# Patient Record
Sex: Male | Born: 1955 | Race: White | Hispanic: No | Marital: Married | Smoking: Former smoker
Health system: Southern US, Community
[De-identification: ages and names within clinical notes are randomized; demographics above are authoritative.]

## PROBLEM LIST (undated history)

## (undated) DIAGNOSIS — K219 Gastro-esophageal reflux disease without esophagitis: Secondary | ICD-10-CM

## (undated) DIAGNOSIS — L570 Actinic keratosis: Secondary | ICD-10-CM

## (undated) DIAGNOSIS — C9110 Chronic lymphocytic leukemia of B-cell type not having achieved remission: Principal | ICD-10-CM

## (undated) DIAGNOSIS — M069 Rheumatoid arthritis, unspecified: Secondary | ICD-10-CM

## (undated) DIAGNOSIS — K649 Unspecified hemorrhoids: Secondary | ICD-10-CM

## (undated) DIAGNOSIS — Z96641 Presence of right artificial hip joint: Secondary | ICD-10-CM

## (undated) DIAGNOSIS — B1910 Unspecified viral hepatitis B without hepatic coma: Secondary | ICD-10-CM

## (undated) DIAGNOSIS — R768 Other specified abnormal immunological findings in serum: Secondary | ICD-10-CM

## (undated) HISTORY — DX: Gastro-esophageal reflux disease without esophagitis: K21.9

## (undated) HISTORY — PX: COLONOSCOPY: SHX174

## (undated) HISTORY — DX: Chronic lymphocytic leukemia of B-cell type not having achieved remission: C91.10

## (undated) HISTORY — PX: HERNIA REPAIR: SHX51

## (undated) HISTORY — DX: Unspecified hemorrhoids: K64.9

## (undated) HISTORY — DX: Other specified abnormal immunological findings in serum: R76.8

## (undated) HISTORY — DX: Presence of right artificial hip joint: Z96.641

## (undated) HISTORY — DX: Unspecified viral hepatitis B without hepatic coma: B19.10

## (undated) HISTORY — DX: Actinic keratosis: L57.0

## (undated) HISTORY — DX: Rheumatoid arthritis, unspecified: M06.9

---

## 2006-08-31 ENCOUNTER — Ambulatory Visit: Payer: Self-pay | Admitting: Internal Medicine

## 2006-08-31 LAB — CONVERTED CEMR LAB
ALT: 49 units/L — ABNORMAL HIGH (ref 0–40)
Alkaline Phosphatase: 74 units/L (ref 39–117)
BUN: 14 mg/dL (ref 6–23)
Bilirubin, Direct: 0.1 mg/dL (ref 0.0–0.3)
Creatinine, Ser: 0.8 mg/dL (ref 0.4–1.5)
Glucose, Bld: 81 mg/dL (ref 70–99)
Hep B S Ab: POSITIVE — AB

## 2008-04-12 ENCOUNTER — Ambulatory Visit: Payer: Self-pay | Admitting: Internal Medicine

## 2009-03-04 ENCOUNTER — Telehealth (INDEPENDENT_AMBULATORY_CARE_PROVIDER_SITE_OTHER): Payer: Self-pay | Admitting: *Deleted

## 2009-03-12 ENCOUNTER — Ambulatory Visit: Payer: Self-pay | Admitting: Internal Medicine

## 2009-04-02 ENCOUNTER — Ambulatory Visit: Payer: Self-pay | Admitting: Family Medicine

## 2009-04-02 DIAGNOSIS — R1031 Right lower quadrant pain: Secondary | ICD-10-CM

## 2009-05-06 ENCOUNTER — Ambulatory Visit: Payer: Self-pay | Admitting: Family Medicine

## 2009-10-01 ENCOUNTER — Ambulatory Visit: Payer: Self-pay | Admitting: Family Medicine

## 2009-10-01 DIAGNOSIS — R5383 Other fatigue: Secondary | ICD-10-CM

## 2009-10-01 DIAGNOSIS — R5381 Other malaise: Secondary | ICD-10-CM

## 2009-10-06 DIAGNOSIS — R945 Abnormal results of liver function studies: Secondary | ICD-10-CM | POA: Insufficient documentation

## 2009-10-06 DIAGNOSIS — D729 Disorder of white blood cells, unspecified: Secondary | ICD-10-CM | POA: Insufficient documentation

## 2009-10-06 LAB — CONVERTED CEMR LAB
Alkaline Phosphatase: 67 units/L (ref 39–117)
BUN: 13 mg/dL (ref 6–23)
Basophils Absolute: 0.1 10*3/uL (ref 0.0–0.1)
Calcium: 9.7 mg/dL (ref 8.4–10.5)
Chloride: 105 meq/L (ref 96–112)
Cholesterol: 185 mg/dL (ref 0–200)
Eosinophils Absolute: 0.6 10*3/uL (ref 0.0–0.7)
GFR calc non Af Amer: 74.83 mL/min (ref >60)
HCT: 44.5 % (ref 39.0–52.0)
Hemoglobin: 15.1 g/dL (ref 13.0–17.0)
LDL Cholesterol: 134 mg/dL — ABNORMAL HIGH (ref 0–99)
MCV: 84.9 fL (ref 78.0–100.0)
Neutro Abs: 4.2 10*3/uL (ref 1.4–7.7)
Neutrophils Relative %: 29.9 % — ABNORMAL LOW (ref 43.0–77.0)
Potassium: 5.3 meq/L — ABNORMAL HIGH (ref 3.5–5.1)
RBC: 5.24 M/uL (ref 4.22–5.81)
RDW: 14.6 % (ref 11.5–14.6)
Sodium: 144 meq/L (ref 135–145)
VLDL: 11 mg/dL (ref 0.0–40.0)
WBC: 14.1 10*3/uL — ABNORMAL HIGH (ref 4.5–10.5)

## 2009-11-03 ENCOUNTER — Encounter: Payer: Self-pay | Admitting: Family Medicine

## 2009-11-05 ENCOUNTER — Encounter (INDEPENDENT_AMBULATORY_CARE_PROVIDER_SITE_OTHER): Payer: Self-pay | Admitting: *Deleted

## 2009-11-19 ENCOUNTER — Ambulatory Visit: Payer: Self-pay | Admitting: Family Medicine

## 2009-11-24 LAB — CONVERTED CEMR LAB
Albumin: 3.8 g/dL (ref 3.5–5.2)
Alkaline Phosphatase: 70 units/L (ref 39–117)
Basophils Absolute: 0 10*3/uL (ref 0.0–0.1)
Basophils Relative: 0.3 % (ref 0.0–3.0)
Eosinophils Relative: 4.6 % (ref 0.0–5.0)
HCT: 42.1 % (ref 39.0–52.0)
Hemoglobin: 14.3 g/dL (ref 13.0–17.0)
Lymphocytes Relative: 66.6 % — ABNORMAL HIGH (ref 12.0–46.0)
Monocytes Absolute: 0.6 10*3/uL (ref 0.1–1.0)
Neutro Abs: 3.6 10*3/uL (ref 1.4–7.7)
Total Bilirubin: 0.5 mg/dL (ref 0.3–1.2)
Total Protein: 6.3 g/dL (ref 6.0–8.3)
WBC: 14.6 10*3/uL — ABNORMAL HIGH (ref 4.5–10.5)

## 2009-12-10 ENCOUNTER — Ambulatory Visit: Payer: Self-pay | Admitting: Family Medicine

## 2009-12-10 DIAGNOSIS — D7289 Other specified disorders of white blood cells: Secondary | ICD-10-CM

## 2009-12-10 DIAGNOSIS — K4090 Unilateral inguinal hernia, without obstruction or gangrene, not specified as recurrent: Secondary | ICD-10-CM | POA: Insufficient documentation

## 2009-12-10 DIAGNOSIS — B182 Chronic viral hepatitis C: Secondary | ICD-10-CM | POA: Insufficient documentation

## 2009-12-10 LAB — CONVERTED CEMR LAB
Basophils Absolute: 0.1 10*3/uL (ref 0.0–0.1)
Basophils Relative: 0.6 % (ref 0.0–3.0)
HCT: 45 % (ref 39.0–52.0)
Hemoglobin: 15.3 g/dL (ref 13.0–17.0)
Lymphocytes Relative: 61 % — ABNORMAL HIGH (ref 12.0–46.0)
Lymphs Abs: 8.6 10*3/uL — ABNORMAL HIGH (ref 0.7–4.0)
MCV: 86.2 fL (ref 78.0–100.0)
Monocytes Relative: 3.8 % (ref 3.0–12.0)
Neutro Abs: 4.1 10*3/uL (ref 1.4–7.7)
Neutrophils Relative %: 29.3 % — ABNORMAL LOW (ref 43.0–77.0)
WBC: 14.1 10*3/uL — ABNORMAL HIGH (ref 4.5–10.5)

## 2009-12-16 ENCOUNTER — Telehealth: Payer: Self-pay | Admitting: Family Medicine

## 2009-12-18 ENCOUNTER — Ambulatory Visit: Payer: Self-pay | Admitting: Oncology

## 2010-01-06 ENCOUNTER — Other Ambulatory Visit: Admission: RE | Admit: 2010-01-06 | Discharge: 2010-01-06 | Payer: Self-pay | Admitting: Oncology

## 2010-01-06 ENCOUNTER — Encounter: Payer: Self-pay | Admitting: Family Medicine

## 2010-01-06 LAB — CBC WITH DIFFERENTIAL (CANCER CENTER ONLY)
BASO#: 0.2 10*3/uL (ref 0.0–0.2)
BASO%: 1.4 % (ref 0.0–2.0)
Eosinophils Absolute: 0.5 10*3/uL (ref 0.0–0.5)
LYMPH#: 8 10*3/uL — ABNORMAL HIGH (ref 0.9–3.3)
LYMPH%: 67.3 % — ABNORMAL HIGH (ref 14.0–48.0)
MCH: 28.3 pg (ref 28.0–33.4)
MCV: 82 fL (ref 82–98)
MONO#: 0.5 10*3/uL (ref 0.1–0.9)
NEUT%: 22.8 % — ABNORMAL LOW (ref 40.0–80.0)
RBC: 4.97 10*6/uL (ref 4.20–5.70)
RDW: 12.9 % (ref 10.5–14.6)

## 2010-01-06 LAB — MORPHOLOGY - CHCC SATELLITE
PLT EST ~~LOC~~: ADEQUATE
RBC Comments: NORMAL

## 2010-01-06 LAB — CMP (CANCER CENTER ONLY)
ALT(SGPT): 41 U/L (ref 10–47)
CO2: 31 mEq/L (ref 18–33)
Sodium: 134 mEq/L (ref 128–145)
Total Protein: 6.2 g/dL — ABNORMAL LOW (ref 6.4–8.1)

## 2010-01-07 LAB — RETICULOCYTES (CHCC)
RBC.: 5.11 MIL/uL (ref 4.22–5.81)
Retic Ct Pct: 0.8 % (ref 0.4–3.1)

## 2010-01-07 LAB — DIRECT ANTIGLOBULIN TEST (NOT AT ARMC): DAT (Complement): NEGATIVE

## 2010-01-07 LAB — IGG, IGA, IGM
IgA: 71 mg/dL (ref 68–378)
IgG (Immunoglobin G), Serum: 846 mg/dL (ref 694–1618)
IgM, Serum: 48 mg/dL — ABNORMAL LOW (ref 60–263)

## 2010-01-12 DIAGNOSIS — C9110 Chronic lymphocytic leukemia of B-cell type not having achieved remission: Secondary | ICD-10-CM

## 2010-01-12 HISTORY — DX: Chronic lymphocytic leukemia of B-cell type not having achieved remission: C91.10

## 2010-01-19 ENCOUNTER — Encounter: Payer: Self-pay | Admitting: Family Medicine

## 2010-01-20 LAB — ZAP-70 - CHCC SATELLITE

## 2010-01-23 ENCOUNTER — Ambulatory Visit: Payer: Self-pay | Admitting: Oncology

## 2010-01-29 ENCOUNTER — Encounter: Payer: Self-pay | Admitting: Family Medicine

## 2010-01-29 LAB — CBC WITH DIFFERENTIAL (CANCER CENTER ONLY)
BASO#: 0.3 10*3/uL — ABNORMAL HIGH (ref 0.0–0.2)
BASO%: 2.1 % — ABNORMAL HIGH (ref 0.0–2.0)
Eosinophils Absolute: 0.7 10*3/uL — ABNORMAL HIGH (ref 0.0–0.5)
HCT: 43.7 % (ref 38.7–49.9)
HGB: 14.9 g/dL (ref 13.0–17.1)
LYMPH#: 10.1 10*3/uL — ABNORMAL HIGH (ref 0.9–3.3)
LYMPH%: 63.4 % — ABNORMAL HIGH (ref 14.0–48.0)
MONO#: 0.8 10*3/uL (ref 0.1–0.9)
MONO%: 5 % (ref 0.0–13.0)
NEUT#: 4 10*3/uL (ref 1.5–6.5)
RBC: 5.32 10*6/uL (ref 4.20–5.70)

## 2010-02-04 ENCOUNTER — Ambulatory Visit (HOSPITAL_COMMUNITY): Admission: RE | Admit: 2010-02-04 | Discharge: 2010-02-04 | Payer: Self-pay | Admitting: Oncology

## 2010-02-23 ENCOUNTER — Encounter: Payer: Self-pay | Admitting: Family Medicine

## 2010-03-18 ENCOUNTER — Ambulatory Visit: Payer: Self-pay | Admitting: Oncology

## 2010-03-24 ENCOUNTER — Encounter: Payer: Self-pay | Admitting: Family Medicine

## 2010-03-24 LAB — CBC WITH DIFFERENTIAL (CANCER CENTER ONLY)
HCT: 42.3 % (ref 38.7–49.9)
LYMPH#: 8.1 10*3/uL — ABNORMAL HIGH (ref 0.9–3.3)
MCHC: 34 g/dL (ref 32.0–35.9)
MCV: 83 fL (ref 82–98)
Platelets: 301 10*3/uL (ref 145–400)
RDW: 13.3 % (ref 10.5–14.6)
WBC: 13.5 10*3/uL — ABNORMAL HIGH (ref 4.0–10.0)

## 2010-03-24 LAB — MORPHOLOGY - CHCC SATELLITE
PLT EST ~~LOC~~: ADEQUATE
Platelet Morphology: NORMAL
RBC Comments: NORMAL

## 2010-07-02 ENCOUNTER — Ambulatory Visit: Payer: Self-pay | Admitting: Oncology

## 2010-07-06 ENCOUNTER — Ambulatory Visit (HOSPITAL_COMMUNITY)
Admission: RE | Admit: 2010-07-06 | Discharge: 2010-07-06 | Payer: Self-pay | Source: Home / Self Care | Attending: Oncology | Admitting: Oncology

## 2010-07-06 LAB — CBC WITH DIFFERENTIAL/PLATELET
BASO%: 0.7 % (ref 0.0–2.0)
Basophils Absolute: 0.1 10*3/uL (ref 0.0–0.1)
Eosinophils Absolute: 0.3 10*3/uL (ref 0.0–0.5)
HCT: 43.4 % (ref 38.4–49.9)
HGB: 14.5 g/dL (ref 13.0–17.1)
LYMPH%: 51.1 % — ABNORMAL HIGH (ref 14.0–49.0)
MCH: 28.2 pg (ref 27.2–33.4)
MCHC: 33.5 g/dL (ref 32.0–36.0)
MCV: 84.2 fL (ref 79.3–98.0)
MONO%: 3.8 % (ref 0.0–14.0)
NEUT%: 42.3 % (ref 39.0–75.0)
Platelets: 285 10*3/uL (ref 140–400)
RBC: 5.16 10*6/uL (ref 4.20–5.82)
lymph#: 6.3 10*3/uL — ABNORMAL HIGH (ref 0.9–3.3)

## 2010-07-06 LAB — CMP (CANCER CENTER ONLY)
Albumin: 3.6 g/dL (ref 3.3–5.5)
BUN, Bld: 16 mg/dL (ref 7–22)
CO2: 32 mEq/L (ref 18–33)
Potassium: 4.1 mEq/L (ref 3.3–4.7)

## 2010-07-07 LAB — LACTATE DEHYDROGENASE: LDH: 186 U/L (ref 94–250)

## 2010-07-15 NOTE — Letter (Signed)
Summary: [facility]  [facility]   Imported By: Lanelle Bal 04/08/2010 10:48:01  _____________________________________________________________________  External Attachment:    Type:   Image     Comment:   External Document

## 2010-07-15 NOTE — Consult Note (Signed)
[facility]   Imported By: Lanelle Bal 01/14/2010 09:09:39  _____________________________________________________________________  External Attachment:    Type:   Image     Comment:   External Document

## 2010-07-15 NOTE — Letter (Signed)
[facility]   Imported By: Lester [place] 03/03/2010 10:49:36  _____________________________________________________________________  External Attachment:    Type:   Image     Comment:   External Document  Appended Document: [facility] reviewed

## 2010-07-15 NOTE — Letter (Signed)
Summary: [place] No Show Letter  [place] at [facility]  [address] [place], [place] [address]   Phone: [phone]  Fax: [phone]    11/05/2009 MRN: [id number]  Philip Tran [address] [place], [place]  [address]   Dear Mr. Manigo,   Our records indicate that you missed your scheduled appointment with ____lab________________ on _5.25.11___________.  Please contact this office to reschedule your appointment as soon as possible.  It is important that you keep your scheduled appointments with your physician, so we can provide you the best care possible.  Please be advised that there may be a charge for "no show" appointments.    Sincerely,   [place] at [facility]

## 2010-07-15 NOTE — Letter (Signed)
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

## 2010-07-15 NOTE — Assessment & Plan Note (Signed)
Summary: F/U DLO   Vital Signs:  Patient profile:   55 year old male Height:      66 inches Weight:      169.6 pounds BMI:     27.47 Temp:     97.9 degrees F oral Pulse rate:   80 / minute Pulse rhythm:   regular BP sitting:   100 / 60  (left arm) Cuff size:   regular  Vitals Entered By: Benny Lennert CMA Duncan Dull) (December 10, 2009 9:53 AM)  History of Present Illness: Chief complaint follow up  elevated CBC, white blood cell count continues to be elevated, and the patient additionally has an elevated lymphocyte count  this is been repeated twice, and both times have been significant with a white blood cell count of approximately 15,000. In reviewing the patient's chart, on his laboratory several years ago, this was not the case, and he had a normal white blood cell count at that time point.  History is significant relevant for chronic hepatitis C, obtained with remote IV drug use, none currently. He has been to the hepatology clinic in [facility], and discussed  Pegasus therapy. he has not followed up with hepatology or any gastroenterologist in  some time.  Additionally, he does have the right-sided hernia the we have discussed in the past, and he ultimately saw  Dr. Jacquenette Shone at [facility], and  though we initially felt that this was  a sports and 38,  ultimately Dr. Jacquenette Shone felt like this was a very small inguinal hernia  that he can palpate and felt that  this could be taken care of surgically.  Review of systems:  Other than the patient's hernia and continued growing complaints, he feels very well really has no complaints. No fevers, chills, sweats, weight loss and otherwise feels okay.  Allergies (verified): No Known Drug Allergies  Past History:  Past Medical History: Hepatitis C Positive, chronic Hep C Prior Hep B infection, but patient not chronically infection  Social History: Avid Building services engineer, golfer, softball player History of remote IV drug use, clean for many years  now  Physical Exam  General:  Well-developed,well-nourished,in no acute distress; alert,appropriate and cooperative throughout examination Head:  Normocephalic and atraumatic without obvious abnormalities. No apparent alopecia or balding. Ears:  no external deformities.   Nose:  no external deformity.   Lungs:  Normal respiratory effort, chest expands symmetrically. Lungs are clear to auscultation, no crackles or wheezes. Heart:  Normal rate and regular rhythm. S1 and S2 normal without gallop, murmur, click, rub or other extra sounds. Abdomen:  Bowel sounds positive,abdomen soft and non-tender without masses, organomegaly or hernias noted. Genitalia:  Testes bilaterally descended without nodularity, tenderness or masses. No scrotal masses or lesions. No penis lesions or urethral discharge. on Valsalva, with close outpatient the patient does have a very small  inguinal hernia I believe that I can palpate along the right inguinal tract. Extremities:  No clubbing, cyanosis, edema, or deformity noted with normal full range of motion of all joints.   Inguinal Nodes:  No significant adenopathy Psych:  Cognition and judgment appear intact. Alert and cooperative with normal attention span and concentration. No apparent delusions, illusions, hallucinations   Impression & Recommendations:  Problem # 1:  LYMPHOCYTOSIS (ICD-288.8) Assessment New I suspect that this elevation is likely from the patient's hepatitis C, which does and is known to have a chronic elevation in white blood cells and lymphocytosis, however this is concerning given that it was not present even  several years ago, and I do not think neoplasm couldn't be ruled out.  At this point, at the time of dictation, the patient's peripheral smear has returned, and the pathologist is recommended additional cytological testing, which I think is appropriate in this case to ensure that there is not an underlying leukemia. I will consult  hematology.  Problem # 2:  WBC, ABNORMAL UNSPEC. (ICD-288.9) Assessment: New  Orders: Venipuncture (04540) TLB-CBC Platelet - w/Differential (85025-CBCD) T- * Misc. Laboratory test [phone]) TLB-CRP-High Sensitivity (C-Reactive Protein) (86140-FCRP) TLB-Sedimentation Rate (ESR) (85652-ESR)  Problem # 3:  LIVER FUNCTION TESTS, ABNORMAL (ICD-794.8) Assessment: Improved stable, likely secondary to hepatitis C. Actually these have improved compared to prior number several years ago.  Problem # 4:  ABDOMINAL PAIN RIGHT LOWER QUADRANT (ICD-789.03) inguinal hernia, we are to refer him to surgery for evaluation, and the patient would request  a consult at [facility] surgery to have this done locally,, given the  difficulty in getting  to [facility]  Problem # 5:  HEPATITIS C, CHRONIC (ICD-070.54)  Other Orders: Surgical Referral (Surgery)  Patient Instructions: 1)  Referral Appointment Information 2)  Day/Date: 3)  Time: 4)  Place/MD: 5)  Address: 6)  Phone/Fax: 7)  Patient given appointment information. Information/Orders faxed/mailed.     Prior Medications (reviewed today): None Current Allergies (reviewed today): No known allergies

## 2010-07-15 NOTE — Assessment & Plan Note (Signed)
Summary: CPX/CLE   Vital Signs:  Patient profile:   55 year old male Height:      66 inches Weight:      172.4 pounds BMI:     27.93 Temp:     97.6 degrees F oral Pulse rate:   84 / minute Pulse rhythm:   regular BP sitting:   110 / 70  (left arm) Cuff size:   regular  Vitals Entered By: Benny Lennert CMA Duncan Dull) (October 01, 2009 8:20 AM)  History of Present Illness: Chief complaint cpx  Colonoscopy? refer PSA, DRE   c/w R sportsman's hernia c/o  04/02/2009: Plays a lot of basketball, softball. Lifting something heavy at work and never has been the same. Only when going to the left. It hurts when he tried to hit a softball, hit a golf ball hard, and always on the right side at area of oblique insertion just adjacent to injuinal canal.  A constant pain now, 24/7. Happened about a year ago. More like 1 1/2 years ago on secondary questioning.   Saw a german MD at [facility], told that never would be better. Needed to back off from all sporting activity.  Has tried essentially nothing. Does a lot of pulling ans straining. 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 Has been doing this for 20 years   Preventive Screening-Counseling & Management  Alcohol-Tobacco     Alcohol Counseling: not indicated; use of alcohol is not excessive or problematic     Smoking Status: quit     Tobacco Counseling: to remain off tobacco products  Caffeine-Diet-Exercise     Does Patient Exercise: yes  Hep-HIV-STD-Contraception     STD Risk: no risk noted     Testicular SE Education/Counseling to perform regular STE      Sexual History:  currently monogamous.        Drug Use:  never.    Clinical Review Panels:  Immunizations   Last Flu Vaccine:  Fluvax 3+ (03/12/2009)  Complete Metabolic Panel   Glucose:  81 (08/31/2006)   Sodium:  143 (08/31/2006)   Potassium:  4.6 (08/31/2006)   Chloride:  108 (08/31/2006)   CO2:  31 (08/31/2006)   BUN:  14 (08/31/2006)  Creatinine:  0.8 (08/31/2006)   Albumin:  3.7 (08/31/2006)   Total Protein:  6.0 (08/31/2006)   Calcium:  8.8 (08/31/2006)   Total Bili:  0.7 (08/31/2006)   Alk Phos:  74 (08/31/2006)   SGPT (ALT):  49 (08/31/2006)   SGOT (AST):  36 (08/31/2006)   Allergies (verified): No Known Drug Allergies  Family History: n/c  Social History: Smoking Status:  quit Does Patient Exercise:  yes STD Risk:  no risk noted Sexual History:  currently monogamous Drug Use:  never  Review of Systems  General: Denies fever, chills, sweats, anorexia, fatigue, weakness, malaise Eyes: Denies blurring, vision loss ENT: Denies earache, nasal congestion, nosebleeds, sore throat, and hoarseness.  Cardiovascular: Denies chest pains, palpitations, syncope, dyspnea on exertion,  Respiratory: Denies cough, dyspnea at rest, excessive sputum,wheeezing GI: Denies nausea, vomiting, diarrhea, constipation, change in bowel habits, abdominal pain, melena, hematochezia GU: Denies dysuria, hematuria, discharge, urinary frequency, urinary hesitancy, nocturia, incontinence, genital sores, decreased libido Musculoskeletal: GROIN PAIN AS DESCRIBED Derm: Denies rash, itching Neuro: Denies  paresthesias, frequent falls, frequent headaches, and difficulty walking.  Psych: Denies depression, anxiety Endocrine: Denies cold intolerance, heat intolerance, polydipsia, polyphagia, polyuria, and unusual weight change.  Heme: Denies enlarged lymph nodes Allergy: No hayfever  Otherwise, the pertinent positives and negatives are listed above and in the HPI, otherwise a full review of systems has been reviewed and is negative unless noted positive.    Impression & Recommendations:  Problem # 1:  HEALTH MAINTENANCE EXAM (ICD-V70.0) The patient's preventative maintenance and recommended screening tests for an annual wellness exam were reviewed in full today. Brought up to date unless services declined.  Counselled on the  importance of diet, exercise, and its role in overall health and mortality. The patient's FH and SH was reviewed, including their home life, tobacco status, and drug and alcohol status.   Problem # 2:  ABDOMINAL PAIN RIGHT LOWER QUADRANT (ICD-789.03) Chronic R sportsman's hernia I believe. Discussed in the fall with Dr. Danice Goltz, and we discussed that Dr. Jacquenette Shone deals with most of these cases at [facility]. Will refer there.  Orders: Surgical Referral (Surgery)  Other Orders: Venipuncture (52841) TLB-Lipid Panel (80061-LIPID) TLB-BMP (Basic Metabolic Panel-BMET) (80048-METABOL) TLB-CBC Platelet - w/Differential (85025-CBCD) TLB-Hepatic/Liver Function Pnl (80076-HEPATIC) TLB-PSA (Prostate Specific Antigen) (84153-PSA)  Patient Instructions: 1)  Referral Appointment Information 2)  Day/Date: 3)  Time: 4)  Place/MD: 5)  Address: 6)  Phone/Fax: 7)  Patient given appointment information. Information/Orders faxed/mailed.   Current Allergies (reviewed today): No known allergies   Physical Exam General Appearance: well developed, well nourished, no acute distress Eyes: conjunctiva and lids normal, PERRLA, EOMI Ears, Nose, Mouth, Throat: TM clear, nares clear, oral exam WNL Neck: supple, no lymphadenopathy, no thyromegaly, no JVD Respiratory: clear to auscultation and percussion, respiratory effort normal Cardiovascular: regular rate and rhythm, S1-S2, no murmur, rub or gallop, no bruits, peripheral pulses normal and symmetric, no cyanosis, clubbing, edema or varicosities Chest: no scars, masses, tenderness; no asymmetry, skin changes, nipple discharge, no gynecomastia   Gastrointestinal: soft, MILD TENDERNESS ON R > L INGUINAL CANAL, no hepatosplenomegaly, masses; active bowel sounds all quadrants, no masses, tenderness, hemorrhoids  Genitourinary: no hernia, testicular mass, penile discharge, priapism or prostate enlargement Lymphatic: no cervical, axillary or inguinal  adenopathy Musculoskeletal: gait normal, muscle tone and strength WNL, no joint swelling, effusions, discoloration, crepitus  Skin: clear, good turgor, color WNL, no rashes, lesions, or ulcerations Neurologic: normal mental status, normal reflexes, normal strength, sensation, and motion Psychiatric: alert; oriented to person, place and time Other Exam:

## 2010-07-15 NOTE — Letter (Signed)
Summary: [facility]   Imported By: Lanelle Bal 01/26/2010 11:14:41  _____________________________________________________________________  External Attachment:    Type:   Image     Comment:   External Document

## 2010-07-15 NOTE — Progress Notes (Signed)
  Phone Note Call from Patient Call back at [phone]   Caller: Patient Summary of Call: Called the patient regarding his blood work and referral to Hematology. With Heathers help in explaining the resuslts of his labs, patient not understanding the explanation of lab tests at all and wants to only discuss this with Dr Patsy Lager. We told the patient the Dr tried to call him personally but was unaable to reach him by phone. Please call the patient to discuss the lab results. QION#[id number] or [phone]. Please let Shirlee Limerick know if patient is agreeable to this referral because patient did not want referral until he spoke directly to Dr Patsy Lager.  Initial call taken by: Carlton Adam,  December 16, 2009 3:37 PM  Follow-up for Phone Call        d/w patient and his wife on the phone.  They would like to pursue Hematological consult, please make as my original orders discuss.  Follow-up by: [name] MD,  December 16, 2009 5:06 PM  Additional Follow-up for Phone Call Additional follow up Details #1::        Appt made witth  Dr Welton Flakes on 12/31/2009 at 1:00pm. Additional Follow-up by: Carlton Adam,  December 17, 2009 4:16 PM

## 2010-07-15 NOTE — Letter (Signed)
Summary: [facility]-General Surgery  [facility]   Imported By: Maryln Gottron 12/17/2009 13:47:34  _____________________________________________________________________  External Attachment:    Type:   Image     Comment:   External Document

## 2011-02-05 ENCOUNTER — Encounter: Payer: Self-pay | Admitting: Family Medicine

## 2011-02-05 ENCOUNTER — Ambulatory Visit (INDEPENDENT_AMBULATORY_CARE_PROVIDER_SITE_OTHER): Payer: BC Managed Care – PPO | Admitting: Family Medicine

## 2011-02-05 DIAGNOSIS — C9110 Chronic lymphocytic leukemia of B-cell type not having achieved remission: Secondary | ICD-10-CM

## 2011-02-05 DIAGNOSIS — R609 Edema, unspecified: Secondary | ICD-10-CM | POA: Insufficient documentation

## 2011-02-05 DIAGNOSIS — K4090 Unilateral inguinal hernia, without obstruction or gangrene, not specified as recurrent: Secondary | ICD-10-CM

## 2011-02-05 LAB — CBC WITH DIFFERENTIAL/PLATELET
Basophils Relative: 1 % (ref 0–1)
Eosinophils Absolute: 0.4 10*3/uL (ref 0.0–0.7)
HCT: 45.5 % (ref 39.0–52.0)
Lymphs Abs: 4.7 10*3/uL — ABNORMAL HIGH (ref 0.7–4.0)
Monocytes Absolute: 0.7 10*3/uL (ref 0.1–1.0)
Neutrophils Relative %: 37 % — ABNORMAL LOW (ref 43–77)
Platelets: 270 10*3/uL (ref 150–400)
RDW: 12.9 % (ref 11.5–15.5)

## 2011-02-05 LAB — COMPREHENSIVE METABOLIC PANEL
AST: 29 U/L (ref 0–37)
Alkaline Phosphatase: 85 U/L (ref 39–117)
CO2: 31 mEq/L (ref 19–32)
Calcium: 10.5 mg/dL (ref 8.4–10.5)
Creat: 1.06 mg/dL (ref 0.50–1.35)
Potassium: 4.7 mEq/L (ref 3.5–5.3)
Total Protein: 7.2 g/dL (ref 6.0–8.3)

## 2011-02-05 NOTE — Assessment & Plan Note (Signed)
If he continues to have pain, then I would have pt f/u with surgery.  I talked to pt about this today.

## 2011-02-05 NOTE — Patient Instructions (Signed)
You can get your results through our phone system.  Follow the instructions on the blue card. Take care.  Let us know if the swelling gets worse.

## 2011-02-05 NOTE — Assessment & Plan Note (Signed)
Check CMET given the edema.  Unclear source.  I doubt this is related to CLL but given his history, check cbc. I doubt DVT.  His edema is resolving and he is nontoxic.  He can use heel lifts for the ankle pain.  He agrees with plan.

## 2011-02-05 NOTE — Progress Notes (Signed)
No known injury.  No bruising.  No fevers.  He initially had pain in L heel (worse than normal), near achillles insertion.  (H/o B ankle pain near achilles insertion). R hand swelling, better today.  Some L ankle edema persists.  Both started about 1-2 weeks ago.    H/o CLL and h/o HCV.    Meds, vitals, and allergies reviewed.   ROS: See HPI.  Otherwise, noncontributory.  nad ncat Neck supple w/o LA rrr ctab Old hernia site mildly ttp but w/o erythema R hand with normal perfusion, normal rom, good cap refill.  Diffusely but minimally puffy w/o erythema/bruising L ankle minimally puffy.  ttp near achilles insertion but not ttp o/w over bony prominences.  Distally nv intact L calf not ttp Able to bear weight.

## 2011-02-10 ENCOUNTER — Telehealth: Payer: Self-pay | Admitting: *Deleted

## 2011-02-10 MED ORDER — TRAMADOL HCL 50 MG PO TBDP: 50.0000 mg | ORAL_TABLET | Freq: Four times a day (QID) | ORAL | Status: DC | PRN

## 2011-02-10 NOTE — Telephone Encounter (Signed)
Please call pt.  Use the tramadol in meantime and f/u with Dr. Patsy Lager if not improved.  Thanks.

## 2011-02-10 NOTE — Telephone Encounter (Signed)
Pt states he is not any better since his visit.  He still has the swelling, he's unable to walk and his wrist is swollen.  Appt made with Dr. Patsy Lager for 9/10 but he is asking for something to be called in to last until that appt.  He's taking ibuprofen now, but that isn't helping.  He is asking for something for pain. Uses [facility].

## 2011-02-10 NOTE — Telephone Encounter (Signed)
Patient advised.

## 2011-02-22 ENCOUNTER — Telehealth: Payer: Self-pay | Admitting: Family Medicine

## 2011-02-22 ENCOUNTER — Telehealth: Payer: Self-pay | Admitting: *Deleted

## 2011-02-22 ENCOUNTER — Other Ambulatory Visit: Payer: Self-pay | Admitting: Oncology

## 2011-02-22 ENCOUNTER — Encounter (HOSPITAL_BASED_OUTPATIENT_CLINIC_OR_DEPARTMENT_OTHER): Payer: BC Managed Care – PPO | Admitting: Oncology

## 2011-02-22 DIAGNOSIS — C9110 Chronic lymphocytic leukemia of B-cell type not having achieved remission: Secondary | ICD-10-CM

## 2011-02-22 DIAGNOSIS — J984 Other disorders of lung: Secondary | ICD-10-CM

## 2011-02-22 DIAGNOSIS — B1920 Unspecified viral hepatitis C without hepatic coma: Secondary | ICD-10-CM

## 2011-02-22 DIAGNOSIS — Z23 Encounter for immunization: Secondary | ICD-10-CM

## 2011-02-22 LAB — COMPREHENSIVE METABOLIC PANEL
ALT: 19 U/L (ref 0–53)
AST: 26 U/L (ref 0–37)
Alkaline Phosphatase: 80 U/L (ref 39–117)
BUN: 15 mg/dL (ref 6–23)
CO2: 30 mEq/L (ref 19–32)
Calcium: 9.5 mg/dL (ref 8.4–10.5)
Creatinine, Ser: 1 mg/dL (ref 0.50–1.35)
Total Protein: 7.1 g/dL (ref 6.0–8.3)

## 2011-02-22 LAB — CBC WITH DIFFERENTIAL/PLATELET
Eosinophils Absolute: 0.1 10*3/uL (ref 0.0–0.5)
HGB: 15.1 g/dL (ref 13.0–17.1)
MONO#: 0.5 10*3/uL (ref 0.1–0.9)
NEUT#: 4.1 10*3/uL (ref 1.5–6.5)
NEUT%: 44.8 % (ref 39.0–75.0)
Platelets: 240 10*3/uL (ref 140–400)
RBC: 5.17 10*6/uL (ref 4.20–5.82)

## 2011-02-22 NOTE — Telephone Encounter (Signed)
Needs labwork faxed over from last office visit.  Is at Dr. Milta Deiters office and is seeing him today.  Fax [phone].

## 2011-02-22 NOTE — Telephone Encounter (Signed)
OK, Ambien 10 mg, 1 po qhs prn insomnia. #30, 1 refill  Try to only use occaisionally.

## 2011-02-22 NOTE — Telephone Encounter (Signed)
Last labwork faxed to dr Rosine Beat office

## 2011-02-22 NOTE — Telephone Encounter (Signed)
Pt's wife asks if Remus Loffler can be called in for the patient. She says he is having a difficult time resting and sleeping.  Uses [facility].  He has tried benedryl in the past, but that didn't really work.

## 2011-02-23 ENCOUNTER — Ambulatory Visit: Payer: BC Managed Care – PPO | Admitting: Family Medicine

## 2011-02-23 LAB — HAPTOGLOBIN: Haptoglobin: 153 mg/dL (ref 30–200)

## 2011-02-23 LAB — DIRECT ANTIGLOBULIN TEST (NOT AT ARMC): DAT IgG: NEGATIVE

## 2011-02-23 MED ORDER — ZOLPIDEM TARTRATE 10 MG PO TABS: 10.0000 mg | ORAL_TABLET | Freq: Every evening | ORAL | Status: DC | PRN

## 2011-02-23 NOTE — Telephone Encounter (Signed)
Medicine called to pharmacy. 

## 2011-02-24 ENCOUNTER — Other Ambulatory Visit: Payer: Self-pay | Admitting: *Deleted

## 2011-02-24 ENCOUNTER — Other Ambulatory Visit: Payer: Self-pay | Admitting: Oncology

## 2011-02-24 DIAGNOSIS — C9590 Leukemia, unspecified not having achieved remission: Secondary | ICD-10-CM

## 2011-02-25 MED ORDER — TRAMADOL HCL 50 MG PO TBDP: 50.0000 mg | ORAL_TABLET | Freq: Four times a day (QID) | ORAL | Status: DC | PRN

## 2011-02-25 NOTE — Telephone Encounter (Signed)
Have pt f/u with PMD if not improved.

## 2011-02-25 NOTE — Telephone Encounter (Signed)
Patient advised and appt made with Dr. Patsy Lager 03/03/11

## 2011-03-03 ENCOUNTER — Ambulatory Visit (INDEPENDENT_AMBULATORY_CARE_PROVIDER_SITE_OTHER): Payer: BC Managed Care – PPO | Admitting: Family Medicine

## 2011-03-03 ENCOUNTER — Encounter: Payer: Self-pay | Admitting: Family Medicine

## 2011-03-03 VITALS — BP 90/60 | HR 73 | Temp 98.0°F | Ht 67.0 in | Wt 161.1 lb

## 2011-03-03 DIAGNOSIS — K4090 Unilateral inguinal hernia, without obstruction or gangrene, not specified as recurrent: Secondary | ICD-10-CM

## 2011-03-03 DIAGNOSIS — IMO0001 Reserved for inherently not codable concepts without codable children: Secondary | ICD-10-CM | POA: Insufficient documentation

## 2011-03-03 DIAGNOSIS — Z23 Encounter for immunization: Secondary | ICD-10-CM

## 2011-03-03 DIAGNOSIS — M069 Rheumatoid arthritis, unspecified: Secondary | ICD-10-CM

## 2011-03-03 DIAGNOSIS — C9110 Chronic lymphocytic leukemia of B-cell type not having achieved remission: Secondary | ICD-10-CM | POA: Insufficient documentation

## 2011-03-03 DIAGNOSIS — R109 Unspecified abdominal pain: Secondary | ICD-10-CM

## 2011-03-03 DIAGNOSIS — M2550 Pain in unspecified joint: Secondary | ICD-10-CM

## 2011-03-03 DIAGNOSIS — R1030 Lower abdominal pain, unspecified: Secondary | ICD-10-CM | POA: Insufficient documentation

## 2011-03-03 LAB — SEDIMENTATION RATE: Sed Rate: 8 mm/hr (ref 0–22)

## 2011-03-03 NOTE — Progress Notes (Addendum)
Subjective:    Patient ID: Philip Tran, male    DOB: [birth year], 55 y.o.   MRN: [id number]  HPI  Philip Tran, a 55 y.o. male presents today in the office for the following:    Had inguinal hernia -- still having pain at R inguinal area, s/p repair a year ago. Has been back to surgeon twice.   2 primary problems. Right groin pain that has been ongoing, lasting longer than one year, status post right inguinal hernia repair at [facility]. The patient is very symptomatic, having pain and limitations with much of his activities, and has seen his surgeon twice in followup, but they felt like that the repair was patent. He may have some scar tissue that was causing some of his discomfort.  The patient also has diffuse complaints of polyarthralgia, hands, wrists, elbows, shoulders, knees. Complains of edema, but I do not really appreciate that much today. He has pain that will last in the upwards of hours in the morning  No history of rheumatic disease.  Patient Active Problem List  Diagnoses  . HEPATITIS C, CHRONIC  . INGUINAL HERNIA, RIGHT  . CLL (chronic lymphoblastic leukemia)   Past Medical History  Diagnosis Date  . 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 Welton Flakes  . Hepatitis C antibody test positive     Chronic Hep C  . Hepatitis B infection     Prior but patient not chronically infected   No past surgical history on file. History  Substance Use Topics  . Smoking status: Former Smoker -- 2.0 packs/day for 20 years    Types: Cigarettes    Quit date: 07/07/2006  . Smokeless tobacco: Not on file  . Alcohol Use: Not on file   No family history on file. No Known Allergies Current Outpatient Prescriptions on File Prior to Visit  Medication Sig Dispense Refill  . TraMADol HCl 50 MG TBDP Take 50 mg by mouth every 6 (six) hours as needed (take for pain with sedation caution).  40 tablet  1  . zolpidem (AMBIEN) 10 MG tablet Take 1 tablet (10 mg total) by mouth at bedtime  as needed for sleep.  30 tablet  0     Review of Systems ROS: GEN: No acute illnesses, no fevers, chills. GI: No n/v/d, eating normally Pulm: No SOB Interactive and getting along well at home.  Otherwise, ROS is as per the HPI.     Objective:   Physical Exam   Physical Exam  Blood pressure 90/60, pulse 73, temperature 98 F (36.7 C), temperature source Oral, height 5\' 7"  (1.702 m), weight 161 lb 1.9 oz (73.084 kg), SpO2 98.00%.  GEN: WDWN, NAD, Non-toxic, A & O x 3 HEENT: Atraumatic, Normocephalic. Neck supple. No masses, No LAD. Ears and Nose: No external deformity. CV: RRR, No M/G/R. No JVD. No thrill. No extra heart sounds. PULM: CTA B, no wheezes, crackles, rhonchi. No retractions. No resp. distress. No accessory muscle use. ABD: S, in the right inguinal area, he is notably tender to palpation, and there is some palpable density there as well it is not freely mobile, ND, +BS. No rebound tenderness. No HSM.  EXTR: No c/c/e NEURO Normal gait.  PSYCH: Normally interactive. Conversant. Not depressed or anxious appearing.  Calm demeanor.        Assessment & Plan:   1. INGUINAL HERNIA, RIGHT    2. Flu vaccine need  Flu vaccine greater than or equal to 3yo preservative free  IM  3. Groin pain    4. Arthralgia  Cyclic citrul peptide antibody, IgG, Sedimentation rate, Rheumatoid factor, ANA    Challenging case. He very well could have some scar tissue in his right inguinal area. He certainly is very symptomatic. I discussed with them, and I think it is reasonable to get a second opinion, but he expressed reservation about that, primarily due to finances.  Polyarthralgia. He certainly could have a component of rheumatological disease given the diffuse involvement, complaints of morning pain lasting hours, and some swelling.   Addendum: At this point, labs have returned.  CCP AB 260 Rh F 35  Lab Results  Component Value Date   ANA NEG 03/03/2011   RF 35* 03/03/2011   ESR  normal  With very high CCP AB, + RhF, polyarthralgia lasting hours, concerning for RA.  Will Consult Dr. Lavenia Atlas for assistance.

## 2011-03-04 LAB — ANA: Anti Nuclear Antibody(ANA): NEGATIVE

## 2011-03-04 LAB — CYCLIC CITRUL PEPTIDE ANTIBODY, IGG: Cyclic Citrullin Peptide Ab: 259.5 U/mL — ABNORMAL HIGH (ref 0.0–5.0)

## 2011-03-04 LAB — RHEUMATOID FACTOR: Rhuematoid fact SerPl-aCnc: 35 IU/mL — ABNORMAL HIGH (ref <=14)

## 2011-03-05 ENCOUNTER — Other Ambulatory Visit: Payer: Self-pay | Admitting: *Deleted

## 2011-03-05 DIAGNOSIS — M069 Rheumatoid arthritis, unspecified: Secondary | ICD-10-CM | POA: Insufficient documentation

## 2011-03-05 NOTE — Progress Notes (Signed)
Addended by: [name] on: 03/05/2011 11:38 AM   Modules accepted: Orders

## 2011-03-05 NOTE — Telephone Encounter (Signed)
Okay to await Dr. Renaye Rakers recommendations Monday.

## 2011-03-05 NOTE — Telephone Encounter (Signed)
This was just refilled on 02/24/11.  Should he have RF this soon?

## 2011-03-05 NOTE — Telephone Encounter (Signed)
Will send to PCP 

## 2011-03-06 MED ORDER — TRAMADOL HCL 50 MG PO TBDP: 50.0000 mg | ORAL_TABLET | Freq: Four times a day (QID) | ORAL | Status: DC | PRN

## 2011-03-08 ENCOUNTER — Telehealth: Payer: Self-pay | Admitting: *Deleted

## 2011-03-08 NOTE — Telephone Encounter (Signed)
If he filled on 03/01/2011 - I am ok refilling, #60, 3 refills

## 2011-03-08 NOTE — Telephone Encounter (Signed)
Pharmacy advised  

## 2011-03-08 NOTE — Telephone Encounter (Signed)
[facility] Triage Call Report Triage Record Num: [id number] Operator: Ether Griffins Patient Name: Philip Tran Call Date & Time: 03/06/2011 10:46:34AM Patient Phone: [phone] PCP: Crawford Givens Patient Gender: Male PCP Fax : Patient DOB: 05-28-1956 Practice Name: [facility] Reason for Call: Morrie Sheldon pharmacist calling about refill on Tramadol. Pt is requesting refill but he just got a 10 day supply filled on 03/01/11.Advised to have pt call office on Monday am to follow up. Callback number is [phone]. Protocol(s) Used: Office Note Recommended Outcome per Protocol: Information Noted and Sent to Office Reason for Outcome: Caller information to office Care Advice: ~ 03/06/2011 10:52:36AM Page 1 of 1 [email or web address]

## 2011-03-24 ENCOUNTER — Telehealth: Payer: Self-pay | Admitting: *Deleted

## 2011-03-24 NOTE — Telephone Encounter (Signed)
Call and call in:  He should be on scheduled antiinflammatories:  Naprosyn 500 mg, 1 po bid, #60, 3 refills.  I think he has an inflammatory arthritis -- he should have an upcoming rheumatology appointment. I can give him a small amount of pain medication for the short run, but he needs to get their input for long-term also.  Vicodin 5-500, 1 po q 4-6 hours prn pain, #30, 0 refills Use sparingly

## 2011-03-24 NOTE — Telephone Encounter (Signed)
Pt states he has been taking about 6 tramadol a day for his arthritis pain and it's not helping.  He's asking that something stronger be called to [facility] pharmacy.  He tried to get a refill on tramadol but pharmacy denied it because it was 2 days early.

## 2011-03-24 NOTE — Telephone Encounter (Signed)
Rx called to pharmacy and patient advised  

## 2011-04-20 ENCOUNTER — Other Ambulatory Visit: Payer: Self-pay | Admitting: *Deleted

## 2011-04-20 MED ORDER — TRAMADOL HCL 50 MG PO TBDP: 50.0000 mg | ORAL_TABLET | Freq: Four times a day (QID) | ORAL | Status: DC | PRN

## 2011-04-20 NOTE — Telephone Encounter (Signed)
Phoned request from patient, please send to [facility] pharmacy.

## 2011-05-07 ENCOUNTER — Ambulatory Visit (INDEPENDENT_AMBULATORY_CARE_PROVIDER_SITE_OTHER): Payer: BC Managed Care – PPO | Admitting: Family Medicine

## 2011-05-07 ENCOUNTER — Encounter: Payer: Self-pay | Admitting: Family Medicine

## 2011-05-07 VITALS — BP 110/70 | HR 61 | Temp 98.2°F | Ht 67.0 in | Wt 164.8 lb

## 2011-05-07 DIAGNOSIS — Z23 Encounter for immunization: Secondary | ICD-10-CM

## 2011-05-07 DIAGNOSIS — R109 Unspecified abdominal pain: Secondary | ICD-10-CM

## 2011-05-07 DIAGNOSIS — Z0000 Encounter for general adult medical examination without abnormal findings: Secondary | ICD-10-CM

## 2011-05-07 DIAGNOSIS — K4090 Unilateral inguinal hernia, without obstruction or gangrene, not specified as recurrent: Secondary | ICD-10-CM

## 2011-05-07 NOTE — Patient Instructions (Signed)
REFERRAL: GO THE THE FRONT ROOM AT THE ENTRANCE OF OUR CLINIC, NEAR CHECK IN. ASK FOR MARION. SHE WILL HELP YOU SET UP YOUR REFERRAL. DATE: TIME:  

## 2011-05-07 NOTE — Progress Notes (Signed)
Patient Name: Philip Tran Date of Birth: Aug 01, 1955 Age: 55 y.o. Medical Record Number: [id number] Gender: male  History of Present Illness:  Philip Tran is a 55 y.o. very pleasant male patient who presents with the following:  CPX:  Preventative Health Maintenance Visit:  Health Maintenance Summary Reviewed and updated, unless pt declines services.  Tobacco History Reviewed. Alcohol: No concerns, no excessive use Exercise Habits: minimal now STD concerns: no risk or activity to increase risk Drug Use: None Encouraged self-testicular check  Health Maintenance  Topic Date Due  . Tetanus/tdap  02/25/1977  . Colonoscopy  02/26/2008  . Influenza Vaccine  03/14/2012    RA? Rheum consult - did not go Lost job - needs a [facility]ps order for hep c  Biggest issue is his hernia s/p inguinal hernia repair Significant pain - all R inguinal  Past Medical History, Surgical History, Social History, Family History, and Problem List have been reviewed in EHR and updated if relevant.  Review of Systems:  General: Denies fever, chills, sweats. No significant weight loss. Eyes: Denies blurring,significant itching ENT: Denies earache, sore throat, and hoarseness. Cardiovascular: Denies chest pains, palpitations, dyspnea on exertion Respiratory: Denies cough, dyspnea at rest,wheeezing Breast: no concerns about lumps GI: Denies nausea, vomiting, diarrhea, constipation, change in bowel habits, abdominal pain, melena, hematochezia. Pain as above GU: Denies penile discharge,  occ ED, no urinary flow / outflow problems. No STD concerns. Musculoskeletal: polyarthralgia Derm: Denies rash, itching Neuro: Denies  paresthesias, frequent falls, frequent headaches Psych: Denies depression, anxiety Endocrine: Denies cold intolerance, heat intolerance, polydipsia Heme: Denies enlarged lymph nodes Allergy: No hayfever   Physical Examination: Filed Vitals:   05/07/11 0812  BP: 110/70    Pulse: 61  Temp: 98.2 F (36.8 C)  TempSrc: Oral  Height: 5\' 7"  (1.702 m)  Weight: 164 lb 12.8 oz (74.753 kg)  SpO2: 99%     Wt Readings from Last 3 Encounters:  05/07/11 164 lb 12.8 oz (74.753 kg)  03/03/11 161 lb 1.9 oz (73.084 kg)  02/05/11 164 lb (74.39 kg)    GEN: well developed, well nourished, no acute distress Eyes: conjunctiva and lids normal, PERRLA, EOMI ENT: TM clear, nares clear, oral exam WNL Neck: supple, no lymphadenopathy, no thyromegaly, no JVD Pulm: clear to auscultation and percussion, respiratory effort normal CV: regular rate and rhythm, S1-S2, no murmur, rub or gallop, no bruits, peripheral pulses normal and symmetric, no cyanosis, clubbing, edema or varicosities Chest: no scars, masses GI: soft, R inguinal area with fullness and density and focally TTP; no hepatosplenomegaly, masses; active bowel sounds all quadrants GU: no hernia, testicular mass, penile discharge, or prostate enlargement Lymph: no cervical, axillary or inguinal adenopathy MSK: gait normal, muscle tone and strength WNL, synovitis at MCP's and in hands and wrists SKIN: clear, good turgor, color WNL, no rashes, lesions, or ulcerations Neuro: normal mental status, normal strength, sensation, and motion Psych: alert; oriented to person, place and time, normally interactive and not anxious or depressed in appearance.   Assessment and Plan:  1. Routine general medical examination at a health care facility    2. INGUINAL HERNIA, RIGHT  Ambulatory referral to General Surgery  3. Abdominal  pain, other specified site  Ambulatory referral to General Surgery  4. Immunization due  Tdap vaccine greater than or equal to 7yo IM   The patient's preventative maintenance and recommended screening tests for an annual wellness exam were reviewed in full today. Brought up to date unless services declined.  Counselled on the importance of diet, exercise, and its role in overall health and  mortality. The patient's FH and SH was reviewed, including their home life, tobacco status, and drug and alcohol status.   Consult gen surg --- complications from prior hernia repair  Orders Placed This Encounter  Procedures  . Tdap vaccine greater than or equal to 7yo IM  . Ambulatory referral to General Surgery    Referral Priority:  Routine    Referral Type:  Surgical    Referral Reason:  Specialty Services Required    Requested Specialty:  General Surgery    Number of Visits Requested:  1    Current Outpatient Prescriptions on File Prior to Visit  Medication Sig Dispense Refill  . TraMADol HCl 50 MG TBDP Take 50 mg by mouth every 6 (six) hours as needed (take for pain with sedation caution).  40 tablet  3    There are no discontinued medications.

## 2011-05-11 ENCOUNTER — Encounter: Payer: Self-pay | Admitting: Family Medicine

## 2011-05-18 ENCOUNTER — Encounter: Payer: Self-pay | Admitting: *Deleted

## 2011-05-18 ENCOUNTER — Other Ambulatory Visit: Payer: Self-pay | Admitting: Family Medicine

## 2011-05-18 ENCOUNTER — Other Ambulatory Visit: Payer: BC Managed Care – PPO

## 2011-05-18 DIAGNOSIS — Z1231 Encounter for screening mammogram for malignant neoplasm of breast: Secondary | ICD-10-CM

## 2011-05-18 DIAGNOSIS — Z1211 Encounter for screening for malignant neoplasm of colon: Secondary | ICD-10-CM

## 2011-05-18 LAB — FECAL OCCULT BLOOD, IMMUNOCHEMICAL: Fecal Occult Bld: NEGATIVE

## 2011-05-21 ENCOUNTER — Telehealth: Payer: Self-pay | Admitting: Internal Medicine

## 2011-05-21 MED ORDER — HYDROCODONE-ACETAMINOPHEN 5-500 MG PO TABS: 1.0000 | ORAL_TABLET | Freq: Four times a day (QID) | ORAL | Status: DC | PRN

## 2011-05-21 NOTE — Telephone Encounter (Signed)
Rx called to pharmacy and patient advised  

## 2011-05-21 NOTE — Telephone Encounter (Signed)
Patient called and would like a refill on his [place]oden 5-500 he stated he hasn't seen the Rheumatologist his insurance ran out and he was laid off from his job.  Please advise.  Last time it was filled was October 10,2012.

## 2011-05-21 NOTE — Telephone Encounter (Signed)
i am ok with that.  vicodin 5-500, 1 po q 4-6 hours prn bad pain, #50, 1 refill

## 2011-06-05 ENCOUNTER — Telehealth: Payer: Self-pay | Admitting: *Deleted

## 2011-06-05 NOTE — Telephone Encounter (Signed)
left message to inform the patient of the new date and time of appointments in 08-2011 printed out calendar and mailed out to the patient

## 2011-06-14 ENCOUNTER — Other Ambulatory Visit: Payer: Self-pay | Admitting: Internal Medicine

## 2011-06-14 MED ORDER — HYDROCODONE-ACETAMINOPHEN 5-500 MG PO TABS: 1.0000 | ORAL_TABLET | Freq: Four times a day (QID) | ORAL | Status: DC | PRN

## 2011-06-14 NOTE — Telephone Encounter (Signed)
Ok to refill 60, 1 refill 

## 2011-06-14 NOTE — Telephone Encounter (Signed)
Refill request

## 2011-07-02 ENCOUNTER — Other Ambulatory Visit: Payer: Self-pay | Admitting: *Deleted

## 2011-07-05 MED ORDER — HYDROCODONE-ACETAMINOPHEN 5-500 MG PO TABS: 1.0000 | ORAL_TABLET | Freq: Four times a day (QID) | ORAL | Status: DC | PRN

## 2011-07-05 NOTE — Telephone Encounter (Signed)
Ok to refill 60, 1 refill 

## 2011-07-05 NOTE — Telephone Encounter (Signed)
Rx called to pharmacy

## 2011-07-27 ENCOUNTER — Other Ambulatory Visit: Payer: Self-pay | Admitting: *Deleted

## 2011-07-27 NOTE — Telephone Encounter (Signed)
#  60 tablets were written on 07/02/2011. 1 refill was given  A little too early. Denied. And he already has a refill.

## 2011-07-27 NOTE — Telephone Encounter (Signed)
Pharmacy advised  

## 2011-07-28 ENCOUNTER — Telehealth: Payer: Self-pay | Admitting: *Deleted

## 2011-07-28 NOTE — Telephone Encounter (Signed)
FYI pt called in to see why his rx was denied, I advised that it was denied b/c it was too early to refill. Pt understood and he stated that the med takes the edge off but that is it, he works part time and has to do heavy lifting at job,  and he doesn't have insurance anymore so he has not been to the Rheumatologist yet because he can't afford it.   (No response needed to this request)

## 2011-07-29 ENCOUNTER — Other Ambulatory Visit: Payer: Self-pay | Admitting: *Deleted

## 2011-07-29 NOTE — Telephone Encounter (Signed)
Denied. 07/02/2011 ordered with 1 refill.

## 2011-08-03 ENCOUNTER — Telehealth: Payer: Self-pay

## 2011-08-03 NOTE — Telephone Encounter (Signed)
Patient has been notified and is not due for refill until March

## 2011-08-03 NOTE — Telephone Encounter (Signed)
Patient called asking about his Vicodin prescription.  I see that it was denied on 2/14 because it was too early.  I couldn't tell if he had been called or not.  Do you want me to call and let him know?

## 2011-08-04 ENCOUNTER — Other Ambulatory Visit: Payer: Self-pay

## 2011-08-04 NOTE — Telephone Encounter (Signed)
Patient calling requesting a refill on his Vicodin. Okay to refill yet?

## 2011-08-04 NOTE — Telephone Encounter (Signed)
Call the pharmacy -- it looks to me like he had a refill already left on the script

## 2011-08-05 NOTE — Telephone Encounter (Signed)
Last refills on vicodin were January 21st and January 31st. So patient does not have anymore refills

## 2011-08-05 NOTE — Telephone Encounter (Signed)
Last refilled 07/15/2011. Too early.

## 2011-08-05 NOTE — Telephone Encounter (Signed)
Patient advised via message on personal cell phone 

## 2011-08-20 ENCOUNTER — Other Ambulatory Visit: Payer: Self-pay | Admitting: *Deleted

## 2011-08-21 NOTE — Telephone Encounter (Signed)
Ok to refill 60, 1 refill 

## 2011-08-23 ENCOUNTER — Telehealth: Payer: Self-pay | Admitting: Oncology

## 2011-08-23 ENCOUNTER — Other Ambulatory Visit: Payer: Self-pay | Admitting: *Deleted

## 2011-08-23 MED ORDER — HYDROCODONE-ACETAMINOPHEN 5-500 MG PO TABS: 1.0000 | ORAL_TABLET | Freq: Four times a day (QID) | ORAL | Status: DC | PRN

## 2011-08-23 MED ORDER — ZOLPIDEM TARTRATE 10 MG PO TABS: 10.0000 mg | ORAL_TABLET | Freq: Every evening | ORAL | Status: DC | PRN

## 2011-08-23 NOTE — Telephone Encounter (Signed)
Ok to refill #30, 3 refills

## 2011-08-23 NOTE — Telephone Encounter (Signed)
rx called to pharmacy 

## 2011-08-23 NOTE — Telephone Encounter (Signed)
Pt called in to cancel his lab,ct scan and md appts due to no insurance. Per the pt he will call back once he gets situated

## 2011-08-23 NOTE — Telephone Encounter (Signed)
Received faxed refill request from pharmacy for Zolpidem. Is it okay to refill medication? 

## 2011-08-24 ENCOUNTER — Other Ambulatory Visit: Payer: BC Managed Care – PPO | Admitting: Lab

## 2011-08-24 ENCOUNTER — Inpatient Hospital Stay (HOSPITAL_COMMUNITY)
Admission: RE | Admit: 2011-08-24 | Discharge: 2011-08-24 | Payer: BC Managed Care – PPO | Source: Ambulatory Visit | Attending: Oncology | Admitting: Oncology

## 2011-08-30 ENCOUNTER — Other Ambulatory Visit: Payer: Self-pay | Admitting: *Deleted

## 2011-08-30 MED ORDER — TRAMADOL HCL 50 MG PO TBDP: 50.0000 mg | ORAL_TABLET | Freq: Four times a day (QID) | ORAL | Status: DC | PRN

## 2011-08-30 NOTE — Telephone Encounter (Signed)
Received faxed refill request from pharmacy for Tramadol.. Is it okay to refill medication?

## 2011-09-02 ENCOUNTER — Ambulatory Visit: Payer: BC Managed Care – PPO | Admitting: Oncology

## 2011-10-04 ENCOUNTER — Other Ambulatory Visit: Payer: Self-pay

## 2011-10-04 NOTE — Telephone Encounter (Signed)
Last refill 09/09/2011  Ok to refill vicodin 5-500, #60, 1 refill

## 2011-10-04 NOTE — Telephone Encounter (Signed)
Pt left v/m requesting Vicodin refill. [facility] pharmacy said gotten last refill 09/09/11. Pt can be reached at cell [phone].

## 2011-10-05 MED ORDER — HYDROCODONE-ACETAMINOPHEN 5-500 MG PO TABS: 1.0000 | ORAL_TABLET | Freq: Four times a day (QID) | ORAL | Status: DC | PRN

## 2011-10-05 NOTE — Telephone Encounter (Signed)
rx called to pharmacy 

## 2011-10-28 ENCOUNTER — Other Ambulatory Visit: Payer: Self-pay | Admitting: Medical Oncology

## 2011-12-09 ENCOUNTER — Other Ambulatory Visit: Payer: Self-pay | Admitting: *Deleted

## 2011-12-09 MED ORDER — HYDROCODONE-ACETAMINOPHEN 5-500 MG PO TABS: 1.0000 | ORAL_TABLET | Freq: Four times a day (QID) | ORAL | Status: AC | PRN

## 2011-12-09 NOTE — Telephone Encounter (Signed)
rx called to pharmacy 

## 2011-12-09 NOTE — Telephone Encounter (Signed)
Ok to refill, #60, 1 ref 

## 2011-12-15 ENCOUNTER — Telehealth: Payer: Self-pay | Admitting: Family Medicine

## 2011-12-15 NOTE — Telephone Encounter (Signed)
Stool sample was not dated with office visit and patient's insurance expired on November 30 and the lab work should have been dated with the office visit.  Patient had made appointments before expiration date in order to be covered and the stool sample is not being covered due to the date not corresponding with office visit prior to expiration.

## 2011-12-15 NOTE — Telephone Encounter (Signed)
Leave a message.  Can call wife at [phone] and she said we can leave a message.

## 2011-12-21 ENCOUNTER — Other Ambulatory Visit: Payer: Self-pay

## 2011-12-21 NOTE — Telephone Encounter (Signed)
Pt left v/m did not think Vicodin called to [facility] 2 weeks ago. Spoke with [facility] pharmacy and did receive refill and pt just picked up refill. Left v.m for pt to ck with [facility] pharmacy refills called in 12/09/11.

## 2011-12-28 ENCOUNTER — Emergency Department: Payer: Self-pay | Admitting: Emergency Medicine

## 2011-12-30 NOTE — Telephone Encounter (Signed)
The stool ifob is ordered the day the lab receives it, you may have to contact [place] @ the [facility] lab. Nothing I can do.

## 2011-12-30 NOTE — Telephone Encounter (Signed)
Terri, can you look at this one?  Patient specifically called to make appt so that all services would fall within effective dates for insurance.

## 2012-01-06 ENCOUNTER — Telehealth: Payer: Self-pay | Admitting: Family Medicine

## 2012-01-06 NOTE — Telephone Encounter (Signed)
Please call the lab and help take care of the cost - we can absorb it. He was very clear to me about this, and I thought it would be covered under his health maintenance policy currently in effect. My error.  [name], MD 01/06/2012, 1:49 PM   Juanna Cao [place] 12/15/2011 10:51 AM Signed  Stool sample was not dated with office visit and patient's insurance expired on November 30 and the lab work should have been dated with the office visit. Patient had made appointments before expiration date in order to be covered and the stool sample is not being covered due to the date not corresponding with office visit prior to expiration.  Juanna Cao [place] 12/15/2011 10:53 AM Signed  Leave a message. Can call wife at [phone] and she said we can leave a message.  Juanna Cao [place] 12/30/2011 9:01 AM Signed  Camelia Eng, can you look at this one? Patient specifically called to make appt so that all services would fall within effective dates for insurance.  Mills Koller 12/30/2011 2:49 PM Signed  The stool ifob is ordered the day the lab receives it, you may have to contact [place] @ the [facility] lab. Nothing I can do.

## 2012-02-04 ENCOUNTER — Other Ambulatory Visit: Payer: Self-pay | Admitting: *Deleted

## 2012-02-04 NOTE — Telephone Encounter (Signed)
Faxed refill request from [facility], last filled 01/14/12.

## 2012-02-04 NOTE — Telephone Encounter (Signed)
Pharmacist says this is a 15 day supply on vicodin.  Do you want to approve refill?

## 2012-02-04 NOTE — Telephone Encounter (Signed)
This is too early, filled 01/14/2012.

## 2012-02-05 NOTE — Telephone Encounter (Signed)
Refill is denied. I am not comfortable in authorizing 120 vicodin in this patient I have no since 04/2011. Denied.

## 2012-02-05 NOTE — Telephone Encounter (Signed)
Deny -- I am not comfortable prescribing 120 vicodin in 1 month, which is a vast increase without evaluation.  Refill is denied.

## 2012-02-07 NOTE — Addendum Note (Signed)
Addended by: Sueanne Margarita on: 02/07/2012 09:39 AM   Modules accepted: Orders

## 2012-02-07 NOTE — Telephone Encounter (Signed)
Left message on cell phone VM, that he would need to schedule visit before refill could be done.

## 2012-02-08 NOTE — Telephone Encounter (Signed)
Advised pharmacy that refill is denied, pt needs office visit.

## 2012-06-14 HISTORY — PX: TOTAL HIP ARTHROPLASTY: SHX124

## 2012-09-08 IMAGING — CT CT CHEST W/ CM
1 of 2 series · 14 of 30 positions shown, 18 images · IV contrast (agent unspecified)
Comparison: 02/04/2010

CLINICAL DATA: Lung nodule on prior chest CT.

CT CHEST WITH CONTRAST
TECHNIQUE: Multidetector CT imaging of the chest was performed
following the standard protocol during bolus administration of
intravenous contrast.
Contrast: 80 ml Bmnipaque-CRR

[Series 2: rtn chest with st · axial · 0.77mm/px · z∈[-374,-118]mm · 14 of 61 slices shown, 18 images]
[im 5/61  mediastinal]
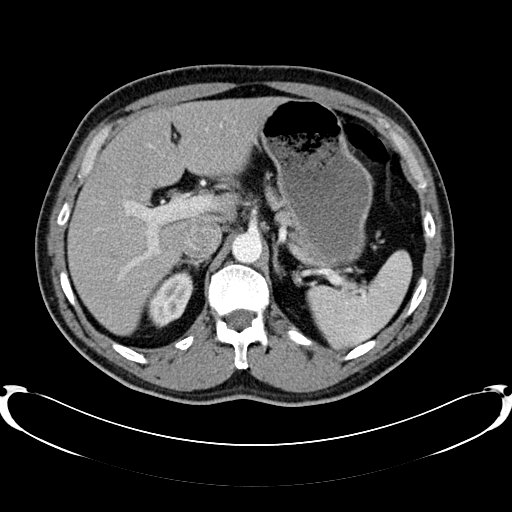
[im 5/61  lung]
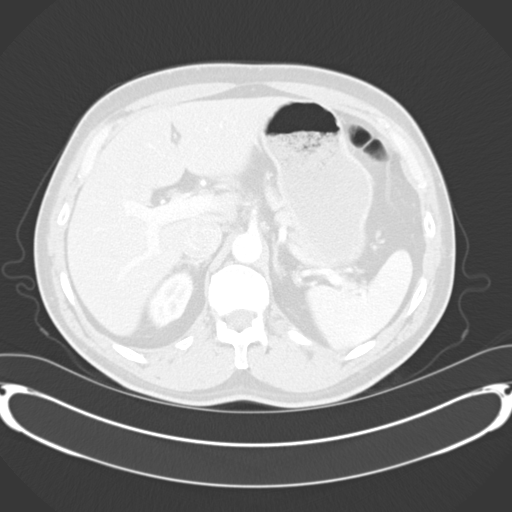
[im 9/61  lung]
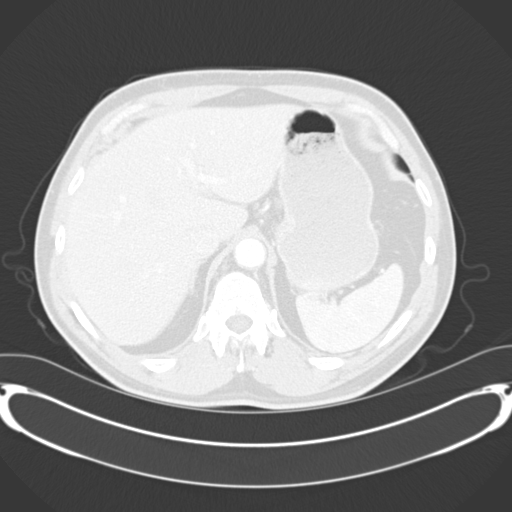
[im 13/61  lung]
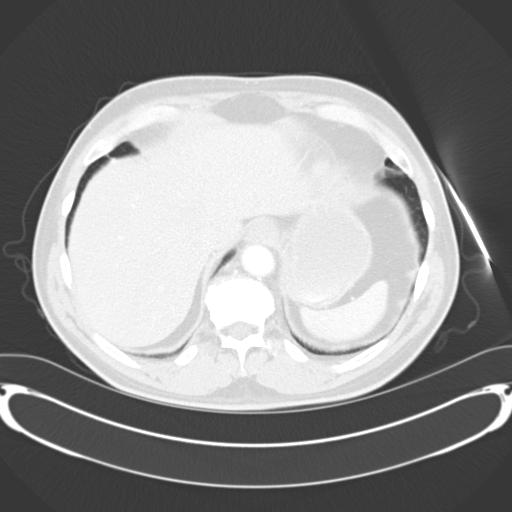
[im 18/61  lung]
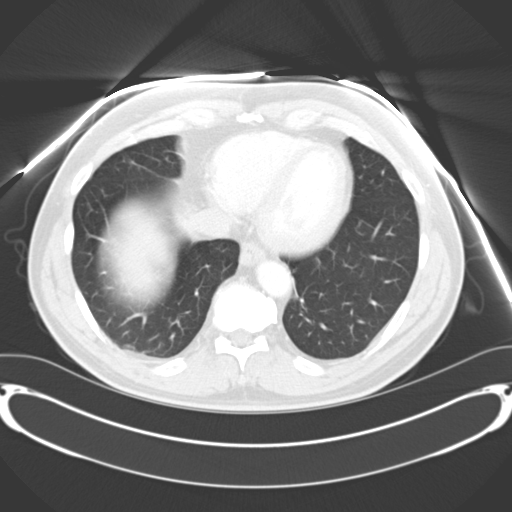
[im 22/61  mediastinal]
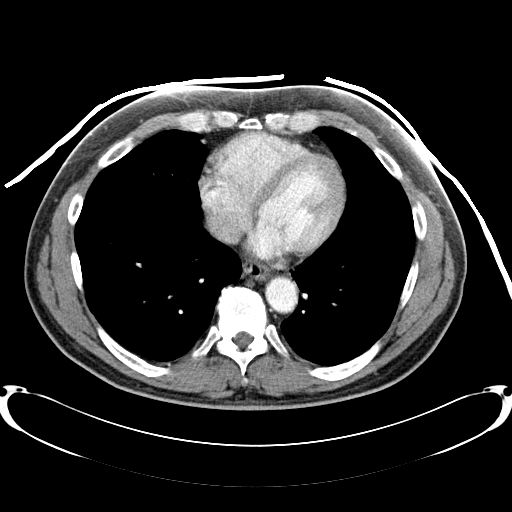
[im 22/61  lung]
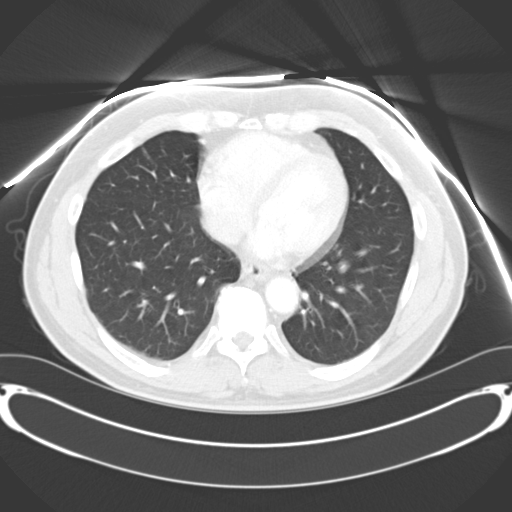
[im 26/61  lung]
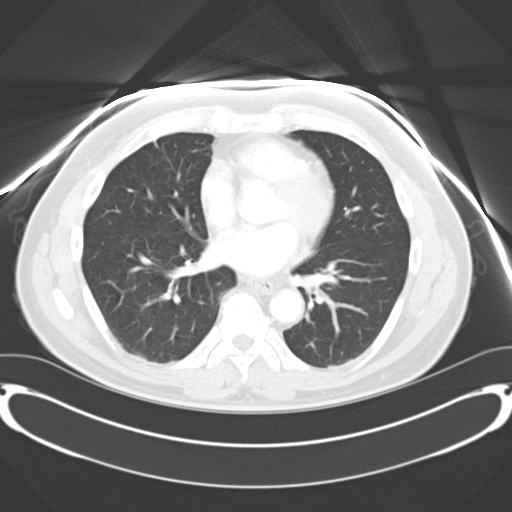
[im 29/61  lung]
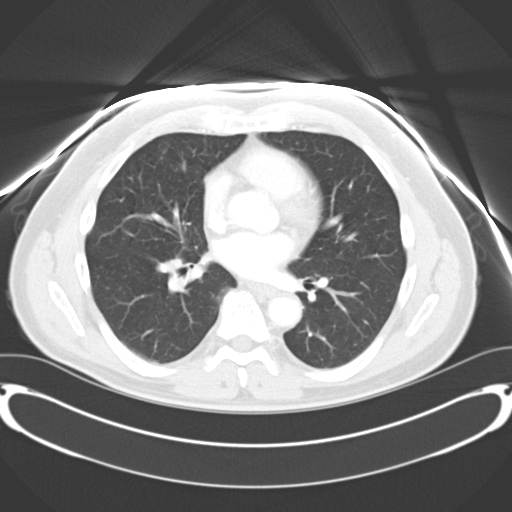
[im 31/61  lung]
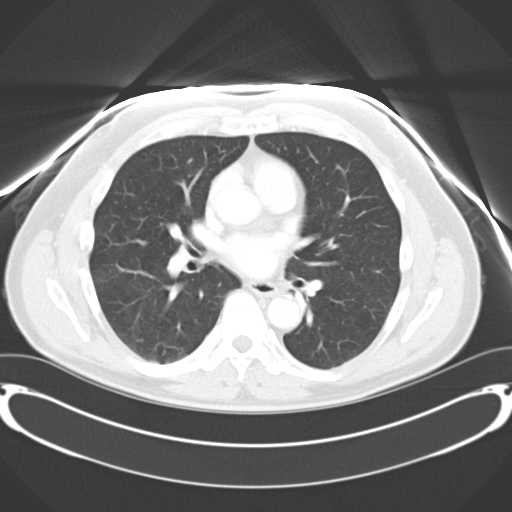
[im 35/61  mediastinal]
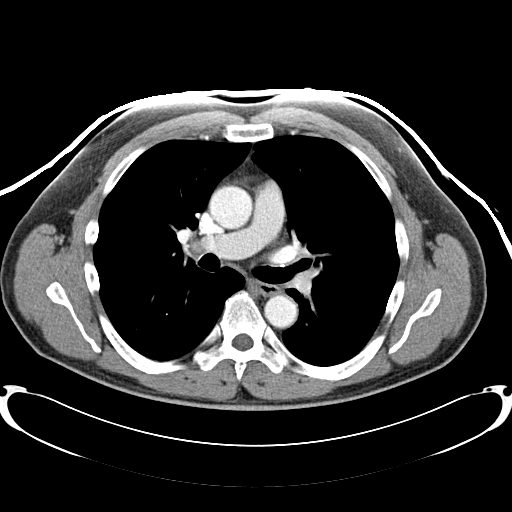
[im 35/61  lung]
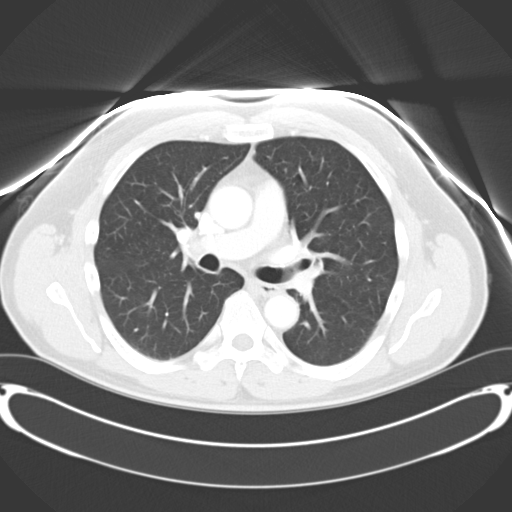
[im 39/61  lung]
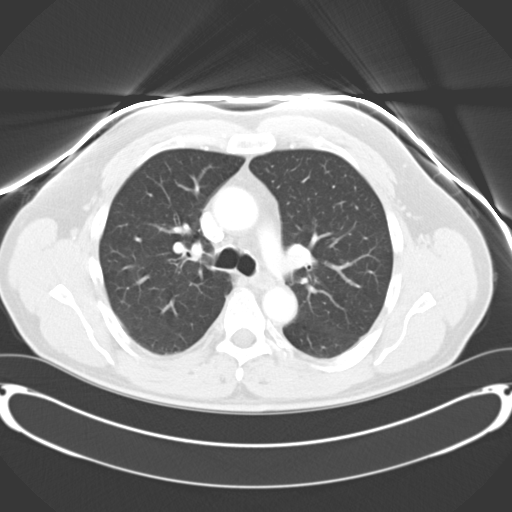
[im 43/61  lung]
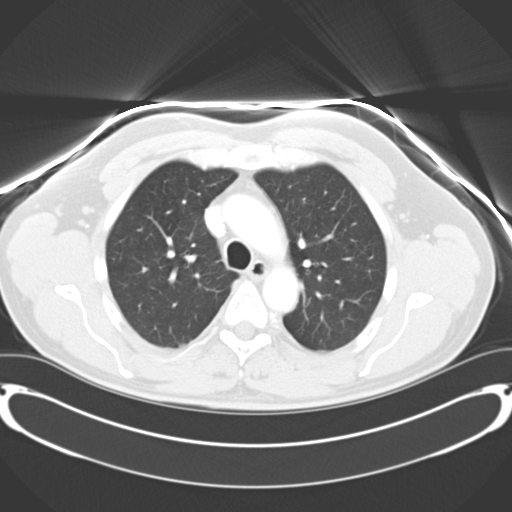
[im 48/61  lung]
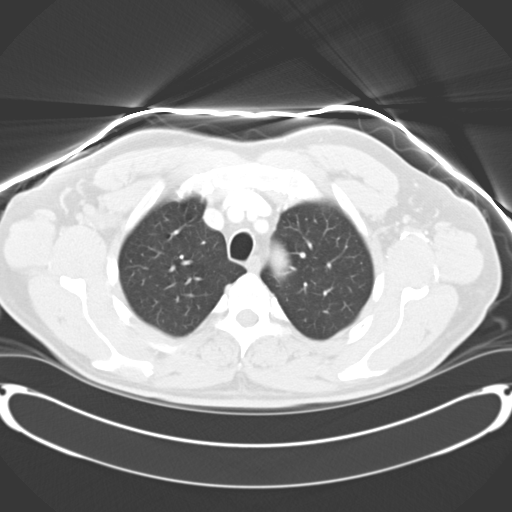
[im 52/61  mediastinal]
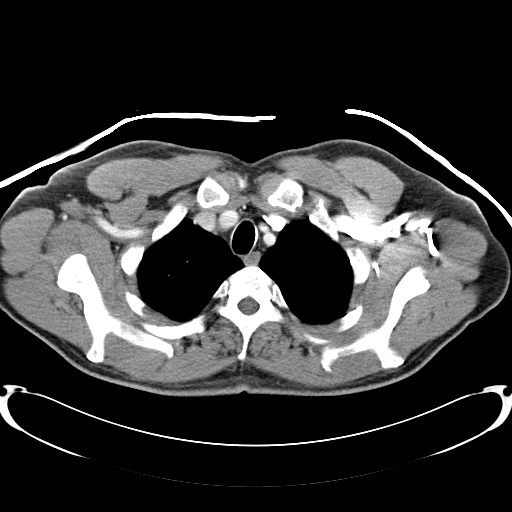
[im 52/61  lung]
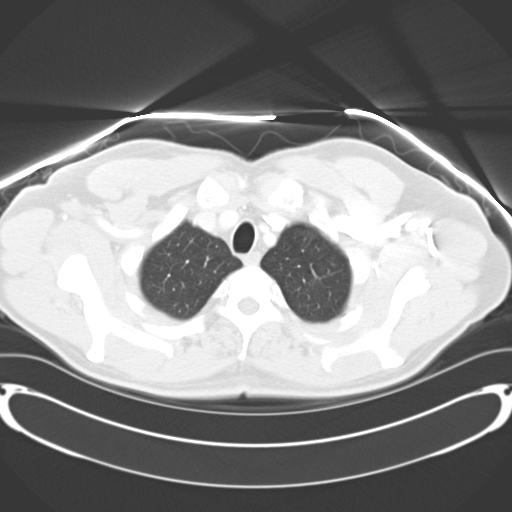
[im 56/61  lung]
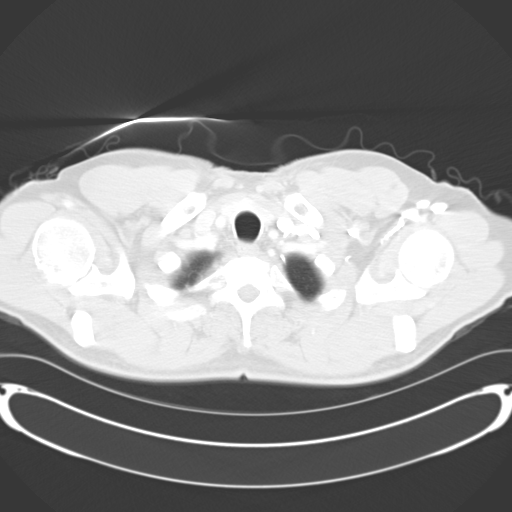

[14 of 30 positions shown; findings below may reference images not displayed]

FINDINGS: No pathologic thoracic adenopathy identified.  Upper
abdominal structures appear unremarkable.

A persistent 0.7 x 0.5 cm left upper lobe nodules present on image
25 of series 5.

The lesion previously seen in the right lower lobe is indistinct
and [REDACTED]mm.

No new lesions identified.
IMPRESSION: 1.  Stable 6 mm left upper lobe pulmonary nodule and stable 2-3 mm
right lower lobe pulmonary nodule.  These have been unchanged over
the past 5 months and remain too small for percutaneous biopsy or
for reliable characterization by PET CT.  Particularly the left
upper lobe nodule requires careful follow-up.  I recommend follow-
up CT of the chest in 6-12 months - contrast is optional.

## 2013-03-09 ENCOUNTER — Ambulatory Visit: Payer: Self-pay | Admitting: Emergency Medicine

## 2013-03-09 LAB — DOT URINE DIP
Blood: NEGATIVE
Glucose,UR: NEGATIVE mg/dL (ref 0–75)
Specific Gravity: 1.01 (ref 1.003–1.030)

## 2014-01-24 ENCOUNTER — Telehealth: Payer: Self-pay | Admitting: Family Medicine

## 2014-01-24 NOTE — Telephone Encounter (Signed)
Note done in error.

## 2014-01-31 ENCOUNTER — Encounter (INDEPENDENT_AMBULATORY_CARE_PROVIDER_SITE_OTHER): Payer: Self-pay

## 2014-01-31 ENCOUNTER — Ambulatory Visit (INDEPENDENT_AMBULATORY_CARE_PROVIDER_SITE_OTHER): Payer: BC Managed Care – PPO | Admitting: Family Medicine

## 2014-01-31 ENCOUNTER — Encounter: Payer: Self-pay | Admitting: Family Medicine

## 2014-01-31 ENCOUNTER — Ambulatory Visit (INDEPENDENT_AMBULATORY_CARE_PROVIDER_SITE_OTHER)
Admission: RE | Admit: 2014-01-31 | Discharge: 2014-01-31 | Disposition: A | Discharge status: Home / Self Care | Payer: BC Managed Care – PPO | Source: Ambulatory Visit | Attending: Family Medicine | Admitting: Family Medicine

## 2014-01-31 VITALS — BP 122/80 | HR 68 | Temp 98.2°F | Ht 66.0 in | Wt 168.0 lb

## 2014-01-31 DIAGNOSIS — M1610 Unilateral primary osteoarthritis, unspecified hip: Secondary | ICD-10-CM

## 2014-01-31 DIAGNOSIS — Z96649 Presence of unspecified artificial hip joint: Secondary | ICD-10-CM

## 2014-01-31 DIAGNOSIS — M1612 Unilateral primary osteoarthritis, left hip: Secondary | ICD-10-CM

## 2014-01-31 DIAGNOSIS — Z23 Encounter for immunization: Secondary | ICD-10-CM

## 2014-01-31 DIAGNOSIS — Z96641 Presence of right artificial hip joint: Secondary | ICD-10-CM

## 2014-01-31 MED ORDER — ZOLPIDEM TARTRATE 10 MG PO TABS: 10.0000 mg | ORAL_TABLET | Freq: Every evening | ORAL | Status: DC | PRN

## 2014-01-31 MED ORDER — TRAMADOL HCL 50 MG PO TBDP: 50.0000 mg | ORAL_TABLET | Freq: Four times a day (QID) | ORAL | Status: DC | PRN

## 2014-01-31 NOTE — Progress Notes (Signed)
Dr. Frederico Hamman T. Issac Moure, MD Primary Care and Sports Medicine [place], [address] Phone: [phone] Fax: [phone]  01/31/2014  Patient: Philip Tran, MRN: [id number], DOB: Jul 04, 1955  Primary Physician:  Owens Loffler, MD  Chief Complaint: Hip Pain  Subjective:   History of Present Illness:  Philip Tran is a 58 y.o. very pleasant male patient who presents with the following:  S/p R THA done at [facility].   Previously was having some deep, sharp pain on the LEFT now.  Walking, lifting is getting worse.   Seen the patient in about 3 years, so he comments about several things have happened him personally and from an employer standpoint.  Primarily today, seems as if he is here for left-sided hip pain. His hip pain is gone now he has had total hip replacement on the RIGHT. Now he has significant loss of motion on the LEFT and the pain in the true groin. He denies any significant back pain.  E has been getting woken up from sleep sometime, so intermittently will take some Ambien. He also takes tramadol long with Tylenol to help with the pain and manage. He does have a history of having some bleeding and ulcer status post taking NSAIDs.  Past Medical History, Surgical History, Social History, Family History, Problem List, Medications, and Allergies have been reviewed and updated if relevant.   GEN: No fevers, chills. Nontoxic. Primarily MSK c/o today. MSK: Detailed in the HPI GI: tolerating PO intake without difficulty Neuro: No numbness, parasthesias, or tingling associated. Otherwise the pertinent positives of the ROS are noted above.   Objective:   Physical Examination: BP 122/80  Pulse 68  Temp(Src) 98.2 F (36.8 C) (Oral)  Ht 5\' 6"  (1.676 m)  Wt 168 lb (76.204 kg)  BMI 27.13 kg/m2   GEN: WDWN, NAD, Non-toxic, A & O x 3 HEENT: Atraumatic, Normocephalic. Neck supple. No masses, No LAD. Ears and Nose: No external deformity. CV: RRR, No  M/G/R. No JVD. No thrill. No extra heart sounds. PULM: CTA B, no wheezes, crackles, rhonchi. No retractions. No resp. distress. No accessory muscle use. EXTR: No c/c/e NEURO Normal gait.  PSYCH: Normally interactive. Conversant. Not depressed or anxious appearing.  Calm demeanor.    HIP EXAM: SIDE: L ROM: Abduction, Flexion, Internal and External range of motion: range of motion is quite poor. He is only able to abduct his hip approximately 20. His total range of motion internal and external rotation is approximately 20-25 Pain with terminal IROM and EROM: yes GTB: NT SLR: NEG Knees: No effusion FABER: NT REVERSE FABER: NT, neg Piriformis: NT at direct palpation Str: flexion: 5/5 abduction: 5/5 adduction: 5/5 Strength testing non-tender  Dg Hip Complete Left  01/31/2014   CLINICAL DATA:  History of osteoarthritis in the left hip  EXAM: LEFT HIP - COMPLETE 2+ VIEW  COMPARISON:  CT scan images dated February 04, 2010 which included the hips  FINDINGS: The bony pelvis is adequately mineralized. The SI joints and sacrum are normal in appearance. There is mild degenerative disc change at L4-5. There is a prosthetic right hip joint. The left hip joint space is mildly narrowed superiorly and there is mild eburnation of the articular surface of the acetabulum. The soft tissues of the pelvis are unremarkable.  IMPRESSION: There is mild degenerative change of the left hip. The prosthetic right hip joint is normal in appearance were visualized. No acute abnormality of the bony pelvis is demonstrated.  Electronically Signed   By: David  Martinique   On: 01/31/2014 17:29    Assessment and Plan:   Primary osteoarthritis of left hip - Plan: DG Hip Complete Left  Need for prophylactic vaccination and inoculation against influenza - Plan: Flu Vaccine QUAD 36+ mos IM  History of total right hip arthroplasty  i will call the patient's hip arthritis more moderate in nature on radiographic films, and  clinically has dramatic loss of motion. I suspect that there is more clinically and there is more there it is apparent on radiographic films.  Doing well status post total hip arthroplasty on the RIGHT.  I did give him some tramadol use as needed along with Tylenol, and we will also give him some Ambien to use on a p.r.n. Basis.  Follow-up: No Follow-up on file.  New Prescriptions   No medications on file   Orders Placed This Encounter  Procedures  . DG Hip Complete Left  . Flu Vaccine QUAD 36+ mos IM    Signed,  Audrey Thull T. Britteney Ayotte, MD, [place]   Patient's Medications  New Prescriptions   No medications on file  Previous Medications   ZOLPIDEM (AMBIEN) 10 MG TABLET    Take 1 tablet (10 mg total) by mouth at bedtime as needed for sleep.  Modified Medications   Modified Medication Previous Medication   TRAMADOL HCL 50 MG TBDP TraMADol HCl 50 MG TBDP      Take 50 mg by mouth every 6 (six) hours as needed (take for pain with sedation caution).    Take 50 mg by mouth every 6 (six) hours as needed (take for pain with sedation caution).   ZOLPIDEM (AMBIEN) 10 MG TABLET zolpidem (AMBIEN) 10 MG tablet      Take 1 tablet (10 mg total) by mouth at bedtime as needed.    Take 1 tablet (10 mg total) by mouth at bedtime as needed.  Discontinued Medications   No medications on file

## 2014-01-31 NOTE — Progress Notes (Signed)
Pre visit review using our clinic review tool, if applicable. No additional management support is needed unless otherwise documented below in the visit note. 

## 2014-02-01 ENCOUNTER — Encounter: Payer: Self-pay | Admitting: Family Medicine

## 2014-02-01 DIAGNOSIS — M1612 Unilateral primary osteoarthritis, left hip: Secondary | ICD-10-CM | POA: Insufficient documentation

## 2014-02-01 DIAGNOSIS — Z96641 Presence of right artificial hip joint: Secondary | ICD-10-CM

## 2014-02-01 HISTORY — DX: Presence of right artificial hip joint: Z96.641

## 2014-03-08 ENCOUNTER — Encounter: Payer: Self-pay | Admitting: Family Medicine

## 2014-03-08 ENCOUNTER — Ambulatory Visit (INDEPENDENT_AMBULATORY_CARE_PROVIDER_SITE_OTHER): Payer: BC Managed Care – PPO | Admitting: Family Medicine

## 2014-03-08 VITALS — BP 120/70 | HR 64 | Temp 98.2°F | Ht 66.0 in | Wt 169.5 lb

## 2014-03-08 DIAGNOSIS — C9110 Chronic lymphocytic leukemia of B-cell type not having achieved remission: Secondary | ICD-10-CM

## 2014-03-08 DIAGNOSIS — K625 Hemorrhage of anus and rectum: Secondary | ICD-10-CM | POA: Insufficient documentation

## 2014-03-08 DIAGNOSIS — IMO0001 Reserved for inherently not codable concepts without codable children: Secondary | ICD-10-CM

## 2014-03-08 DIAGNOSIS — K649 Unspecified hemorrhoids: Secondary | ICD-10-CM | POA: Insufficient documentation

## 2014-03-08 LAB — CBC WITH DIFFERENTIAL/PLATELET
BASOS PCT: 0.3 % (ref 0.0–3.0)
Basophils Absolute: 0 10*3/uL (ref 0.0–0.1)
EOS PCT: 2.7 % (ref 0.0–5.0)
Eosinophils Absolute: 0.4 10*3/uL (ref 0.0–0.7)
HCT: 46.6 % (ref 39.0–52.0)
Hemoglobin: 15.2 g/dL (ref 13.0–17.0)
LYMPHS ABS: 7.9 10*3/uL — AB (ref 0.7–4.0)
Lymphocytes Relative: 60.4 % — ABNORMAL HIGH (ref 12.0–46.0)
MCHC: 32.5 g/dL (ref 30.0–36.0)
MCV: 84.5 fl (ref 78.0–100.0)
MONOS PCT: 4 % (ref 3.0–12.0)
Monocytes Absolute: 0.5 10*3/uL (ref 0.1–1.0)
NEUTROS PCT: 32.6 % — AB (ref 43.0–77.0)
Neutro Abs: 4.3 10*3/uL (ref 1.4–7.7)
PLATELETS: 272 10*3/uL (ref 150.0–400.0)
RBC: 5.51 Mil/uL (ref 4.22–5.81)
RDW: 15 % (ref 11.5–15.5)
WBC: 13.1 10*3/uL — ABNORMAL HIGH (ref 4.0–10.5)

## 2014-03-08 MED ORDER — HYDROCORTISONE ACE-PRAMOXINE 2.5-1 % RE CREA: 1.0000 "application " | TOPICAL_CREAM | Freq: Three times a day (TID) | RECTAL | Status: DC

## 2014-03-08 NOTE — Progress Notes (Signed)
   Subjective:    Patient ID: Philip Tran, male    DOB: 1955-08-28, 58 y.o.   MRN: [id number]  HPI 58 year old male pt of Dr. Lillie Fragmin with CLL, chronic hepatitis and RA presents with new onset itching and pain at rectum x 1 week. He has seen brbpr , approximately 1/4 cup blood in toilet at one time but continue off and on at smaller amounts. He has been feeling nauseous and weak. He has not been eating in much because he is worried about having a BM.  No abdominal pain, no fever.   He has tried prep H without any relief.  He has never had a colonoscopy. No family history of colon cancer.  He has not been treated for CLL in over 1 year.  He has been taking tylenol and tramadol for hip pain.     Review of Systems  Constitutional: Positive for fatigue.  HENT: Negative for sinus pressure.   Respiratory: Negative for shortness of breath.   Cardiovascular: Negative for chest pain.  Gastrointestinal: Negative for nausea.       Objective:   Physical Exam  Constitutional: Vital signs are normal. He appears well-developed and well-nourished.  Fatigued but no toxic appearing  HENT:  Head: Normocephalic.  Right Ear: Hearing normal.  Left Ear: Hearing normal.  Nose: Nose normal.  Mouth/Throat: Oropharynx is clear and moist and mucous membranes are normal.  Neck: Trachea normal. Carotid bruit is not present. No mass and no thyromegaly present.  Cardiovascular: Normal rate, regular rhythm and normal pulses.  Exam reveals no gallop, no distant heart sounds and no friction rub.   No murmur heard. No peripheral edema  Pulmonary/Chest: Effort normal and breath sounds normal. No respiratory distress.  Abdominal: Normal appearance and bowel sounds are normal. There is no tenderness.  Genitourinary: Rectal exam shows external hemorrhoid and tenderness.  Irritated and one moderate size firm hemorrhoid, possibly thrombosed but not very tender  Neurological: He is alert.  Skin: Skin is  warm, dry and intact. No rash noted.  Psychiatric: He has a normal mood and affect. His speech is normal and behavior is normal. Thought content normal.          Assessment & Plan:

## 2014-03-08 NOTE — Assessment & Plan Note (Signed)
Possible small thrombosed ext hemorrhoid as well. Will treat with topical steroid and  Analgesic. Start sitz baths 2-3 times daily.  Follow up if not improving.

## 2014-03-08 NOTE — Assessment & Plan Note (Signed)
Pt not interested in treatment with onc at the moment.

## 2014-03-08 NOTE — Assessment & Plan Note (Signed)
Will eval cbc.

## 2014-03-08 NOTE — Patient Instructions (Addendum)
Stop at lab on way pout to check hemoglobin and other blood cells. Treat constipation with water, fiber and miralax to soften stool.  Start warm water sitz baths for 15 min 2-3 times daily. Apply topical medication to treat.  Call if not improving as expected.  Go to ER if large amount of blood loss in stool.

## 2014-03-08 NOTE — Progress Notes (Signed)
Pre visit review using our clinic review tool, if applicable. No additional management support is needed unless otherwise documented below in the visit note. 

## 2014-03-08 NOTE — Addendum Note (Signed)
Addended by: Eliezer Lofts E on: 03/08/2014 02:25 PM   Modules accepted: Orders

## 2014-03-09 ENCOUNTER — Encounter: Payer: Self-pay | Admitting: Family Medicine

## 2014-03-11 ENCOUNTER — Telehealth: Payer: Self-pay | Admitting: Family Medicine

## 2014-03-11 NOTE — Telephone Encounter (Signed)
Can you send him a note on my chart stating this or call him to let him know. Thanks.

## 2014-03-11 NOTE — Telephone Encounter (Signed)
This pt has requested that his cbc be released to mychart. i am unsure how to do this. Can you help?

## 2014-03-11 NOTE — Telephone Encounter (Signed)
You have to release it at the time you sign it.  It will automatically release to his MyChart 03/14/2014.

## 2014-03-12 NOTE — Telephone Encounter (Signed)
Note sent in MyChart yesterday that results would be released on 03/14/2014 and if he needed those results before then, to let me know and I would mail him a copy.

## 2014-03-15 ENCOUNTER — Ambulatory Visit: Payer: Self-pay | Admitting: Internal Medicine

## 2014-04-14 ENCOUNTER — Ambulatory Visit: Payer: Self-pay | Admitting: Internal Medicine

## 2014-08-14 ENCOUNTER — Other Ambulatory Visit: Payer: Self-pay | Admitting: Family Medicine

## 2014-08-14 NOTE — Telephone Encounter (Signed)
Last office visit 03/08/2014 with Dr. Diona Browner.  Last refilled 02/23/2011 for #30 with no refills.  Ok to refill?

## 2014-08-14 NOTE — Telephone Encounter (Signed)
Ok to refill.  #30, 1 refill 

## 2014-08-15 NOTE — Telephone Encounter (Signed)
Called to [facility].

## 2014-11-22 ENCOUNTER — Telehealth: Payer: Self-pay | Admitting: Family Medicine

## 2014-11-22 NOTE — Telephone Encounter (Signed)
Patient's wife called to find out if patient can be seen sooner than 12/19/14 for his physical.  Patient is thinking about changing jobs and will lose his insurance. Patient hasn't had a physical at our office in several years.  Can patient be seen sooner?

## 2014-11-22 NOTE — Telephone Encounter (Signed)
30 mins. I will let you fit it in where you think best. Non-emergent. Not on a monday

## 2014-11-25 ENCOUNTER — Other Ambulatory Visit: Payer: Self-pay | Admitting: Family Medicine

## 2014-11-25 NOTE — Telephone Encounter (Signed)
Called to [facility].

## 2014-11-25 NOTE — Telephone Encounter (Signed)
Last office visit 03/08/2014 with Dr. Diona Browner.  Last refilled 08/15/2014 for #30 with 1 refill.  Ok to refill?

## 2014-11-25 NOTE — Telephone Encounter (Signed)
30, 1 ref

## 2014-11-27 ENCOUNTER — Other Ambulatory Visit: Payer: Self-pay | Admitting: Family Medicine

## 2014-11-27 DIAGNOSIS — Z13220 Encounter for screening for lipoid disorders: Secondary | ICD-10-CM

## 2014-11-27 DIAGNOSIS — Z79899 Other long term (current) drug therapy: Secondary | ICD-10-CM

## 2014-11-27 DIAGNOSIS — Z125 Encounter for screening for malignant neoplasm of prostate: Secondary | ICD-10-CM

## 2014-11-29 ENCOUNTER — Encounter: Payer: Self-pay | Admitting: *Deleted

## 2014-11-29 ENCOUNTER — Other Ambulatory Visit (INDEPENDENT_AMBULATORY_CARE_PROVIDER_SITE_OTHER): Payer: BC Managed Care – PPO

## 2014-11-29 DIAGNOSIS — Z125 Encounter for screening for malignant neoplasm of prostate: Secondary | ICD-10-CM

## 2014-11-29 DIAGNOSIS — Z13220 Encounter for screening for lipoid disorders: Secondary | ICD-10-CM

## 2014-11-29 DIAGNOSIS — Z79899 Other long term (current) drug therapy: Secondary | ICD-10-CM

## 2014-11-29 LAB — BASIC METABOLIC PANEL
BUN: 13 mg/dL (ref 6–23)
CALCIUM: 9.3 mg/dL (ref 8.4–10.5)
CO2: 29 meq/L (ref 19–32)
Chloride: 106 mEq/L (ref 96–112)
Creatinine, Ser: 1.03 mg/dL (ref 0.40–1.50)
GFR: 79.18 mL/min (ref >60.00)
Glucose, Bld: 100 mg/dL — ABNORMAL HIGH (ref 70–99)
Potassium: 4.6 mEq/L (ref 3.5–5.1)
SODIUM: 140 meq/L (ref 135–145)

## 2014-11-29 LAB — CBC WITH DIFFERENTIAL/PLATELET
Basophils Absolute: 0.1 10*3/uL (ref 0.0–0.1)
Basophils Relative: 0.5 % (ref 0.0–3.0)
EOS PCT: 4.9 % (ref 0.0–5.0)
Eosinophils Absolute: 0.8 10*3/uL — ABNORMAL HIGH (ref 0.0–0.7)
HCT: 46.8 % (ref 39.0–52.0)
Hemoglobin: 15.4 g/dL (ref 13.0–17.0)
Lymphocytes Relative: 61.3 % — ABNORMAL HIGH (ref 12.0–46.0)
Lymphs Abs: 9.9 10*3/uL — ABNORMAL HIGH (ref 0.7–4.0)
MCHC: 32.8 g/dL (ref 30.0–36.0)
MCV: 83.6 fl (ref 78.0–100.0)
MONOS PCT: 4.9 % (ref 3.0–12.0)
Monocytes Absolute: 0.8 10*3/uL (ref 0.1–1.0)
NEUTROS PCT: 28.4 % — AB (ref 43.0–77.0)
Neutro Abs: 4.6 10*3/uL (ref 1.4–7.7)
Platelets: 303 10*3/uL (ref 150.0–400.0)
RBC: 5.6 Mil/uL (ref 4.22–5.81)
RDW: 14.8 % (ref 11.5–15.5)
WBC: 16.1 10*3/uL — AB (ref 4.0–10.5)

## 2014-11-29 LAB — LIPID PANEL
CHOL/HDL RATIO: 7
Cholesterol: 232 mg/dL — ABNORMAL HIGH (ref 0–200)
HDL: 32.9 mg/dL — ABNORMAL LOW (ref >39.00)
LDL CALC: 173 mg/dL — AB (ref 0–99)
NonHDL: 199.1
Triglycerides: 132 mg/dL (ref 0.0–149.0)
VLDL: 26.4 mg/dL (ref 0.0–40.0)

## 2014-11-29 LAB — HEPATIC FUNCTION PANEL
ALT: 20 U/L (ref 0–53)
AST: 22 U/L (ref 0–37)
Albumin: 4.2 g/dL (ref 3.5–5.2)
Alkaline Phosphatase: 88 U/L (ref 39–117)
BILIRUBIN DIRECT: 0.1 mg/dL (ref 0.0–0.3)
Total Bilirubin: 0.5 mg/dL (ref 0.2–1.2)
Total Protein: 6.2 g/dL (ref 6.0–8.3)

## 2014-11-29 LAB — PSA: PSA: 0.61 ng/mL (ref 0.10–4.00)

## 2014-12-02 ENCOUNTER — Telehealth: Payer: Self-pay | Admitting: Family Medicine

## 2014-12-02 NOTE — Telephone Encounter (Signed)
Patient notified as instructed by telephone and verbalized understanding. 

## 2014-12-02 NOTE — Telephone Encounter (Signed)
Okay to do letter?

## 2014-12-02 NOTE — Telephone Encounter (Signed)
i can do at his CPX on Wednesday.

## 2014-12-02 NOTE — Telephone Encounter (Signed)
Pt needs a letter stating that pt was her and has been screened for cholesterol and other labs tests.  This is used to get an incentive from his insurance company.  A note just stating that he was here does not help, it needs to say he has had labs and screenings. Call back number [phone] when ready to be picked up

## 2014-12-04 ENCOUNTER — Ambulatory Visit (INDEPENDENT_AMBULATORY_CARE_PROVIDER_SITE_OTHER): Payer: BC Managed Care – PPO | Admitting: Family Medicine

## 2014-12-04 ENCOUNTER — Encounter: Payer: Self-pay | Admitting: Family Medicine

## 2014-12-04 VITALS — BP 122/88 | HR 80 | Temp 97.7°F | Ht 65.75 in | Wt 169.2 lb

## 2014-12-04 DIAGNOSIS — Z1211 Encounter for screening for malignant neoplasm of colon: Secondary | ICD-10-CM

## 2014-12-04 DIAGNOSIS — Z0000 Encounter for general adult medical examination without abnormal findings: Secondary | ICD-10-CM | POA: Diagnosis not present

## 2014-12-04 NOTE — Progress Notes (Signed)
Pre visit review using our clinic review tool, if applicable. No additional management support is needed unless otherwise documented below in the visit note. 

## 2014-12-04 NOTE — Patient Instructions (Signed)

## 2014-12-04 NOTE — Progress Notes (Signed)
Dr. Frederico Hamman T. Rayder Sullenger, MD, [facility] [place], [address] Phone: [phone] Fax: [phone]  12/04/2014  Patient: Philip Tran, MRN: [id number], DOB: [birth year], 59 y.o.  Primary Physician:  Owens Loffler, MD  Chief Complaint: Annual Exam  Subjective:   Philip Tran is a 59 y.o. pleasant patient who presents with the following:  Preventative Health Maintenance Visit:  Health Maintenance Summary Reviewed and updated, unless pt declines services.  Tobacco History Reviewed. Alcohol: No concerns, no excessive use Exercise Habits: Some activity, rec at least 30 mins 5 times a week STD concerns: no risk or activity to increase risk Drug Use: None Encouraged self-testicular check  Feeling pretty well, has backed off some on his exercise.  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 HIV Screening  02/25/1973  . COLONOSCOPY  02/26/2008  . INFLUENZA VACCINE  01/13/2015  . TETANUS/TDAP  05/06/2021   Immunization History  Administered Date(s) Administered  . Influenza Split 03/03/2011  . Influenza Whole 04/12/2008, 03/12/2009  . Influenza,inj,Quad PF,36+ Mos 01/31/2014  . Tdap 05/07/2011   Patient Active Problem List   Diagnosis Date Noted  . Chronic lymphoblastic leukemia 03/03/2011    Priority: High  . HEPATITIS C, CHRONIC 12/10/2009    Priority: High  . Acute hemorrhoid 03/08/2014  . 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 Rheumatoid arthritis(714.0) 03/05/2011   Past Medical History  Diagnosis Date  . 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 Humphrey Rolls  . Hepatitis C antibody test positive     Chronic Hep C  . Hepatitis B infection     Prior but patient not chronically infected  . 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 Total hip arthroplasty Right 2014    [facility]   History   Social History  . Marital Status: Married    Spouse Name: N/A  . Number of Children: N/A  . Years of Education: N/A   Occupational History  . Not on file.   Social History Main Topics  . Smoking status: Former Smoker -- 2.00 packs/day for 20 years    Types: Cigarettes    Quit date: 07/07/2006  . Smokeless tobacco: Never Used  . Alcohol Use: 0.0 oz/week    0 Standard drinks or equivalent per week     Comment: occ  . Drug Use: No  . Sexual Activity: Not on file   Other Topics Concern  . Not on file   Social History Narrative   Avid basketball player, golfer, softball player.   History of remote IV drug use, clean for many years now.   No family history on file. No Known Allergies  Medication list has been reviewed and updated.   General: Denies fever, chills, sweats. No significant weight loss. Eyes: Denies blurring,significant itching ENT: Denies earache, sore throat, and hoarseness. Cardiovascular: Denies chest pains, palpitations, dyspnea on exertion Respiratory: Denies cough, dyspnea at rest,wheeezing Breast: no concerns about lumps GI: Denies nausea, vomiting, diarrhea, constipation, change in bowel habits, abdominal pain, melena, hematochezia GU: Denies penile discharge, ED, urinary flow / outflow problems. No STD concerns. Musculoskeletal: Denies back pain, joint pain Derm: Denies rash, itching Neuro: Denies  paresthesias, frequent falls, frequent headaches Psych: Denies depression, anxiety Endocrine: Denies cold  intolerance, heat intolerance, polydipsia Heme: Denies enlarged lymph nodes Allergy: No hayfever  Objective:   BP 122/88 mmHg  Pulse 80  Temp(Src) 97.7 F (36.5 C) (Oral)  Ht 5' 5.75" (1.67 m)  Wt 169 lb 4 oz (76.771 kg)  BMI 27.53 kg/m2  SpO2 95% Ideal Body Weight: Weight in (lb) to have BMI = 25: 153.4  No exam data present  GEN: well developed, well  nourished, no acute distress Eyes: conjunctiva and lids normal, PERRLA, EOMI ENT: TM clear, nares clear, oral exam WNL Neck: supple, no lymphadenopathy, no thyromegaly, no JVD Pulm: clear to auscultation and percussion, respiratory effort normal CV: regular rate and rhythm, S1-S2, no murmur, rub or gallop, no bruits, peripheral pulses normal and symmetric, no cyanosis, clubbing, edema or varicosities GI: soft, non-tender; no hepatosplenomegaly, masses; active bowel sounds all quadrants GU: no hernia, testicular mass, penile discharge Lymph: no cervical, axillary or inguinal adenopathy MSK: gait normal, muscle tone and strength WNL, no joint swelling, effusions, discoloration, crepitus  SKIN: clear, good turgor, color WNL, no rashes, lesions, or ulcerations Neuro: normal mental status, normal strength, sensation, and motion Psych: alert; oriented to person, place and time, normally interactive and not anxious or depressed in appearance. All labs reviewed with patient.  Lipids:    Component Value Date/Time   CHOL 232* 11/29/2014 0842   TRIG 132.0 11/29/2014 0842   HDL 32.90* 11/29/2014 0842   VLDL 26.4 11/29/2014 0842   CHOLHDL 7 11/29/2014 0842   CBC: CBC Latest Ref Rng 11/29/2014 03/08/2014 02/22/2011  WBC 4.0 - 10.5 K/uL 16.1(H) 13.1(H) 9.1  Hemoglobin 13.0 - 17.0 g/dL 15.4 15.2 15.1  Hematocrit 39.0 - 52.0 % 46.8 46.6 44.8  Platelets 150.0 - 400.0 K/uL 303.0 272.0 518    Basic Metabolic Panel:    Component Value Date/Time   NA 140 11/29/2014 0842   NA 144 07/06/2010 1236   K 4.6 11/29/2014 0842   K 4.1 07/06/2010 1236   CL 106 11/29/2014 0842   CL 101 07/06/2010 1236   CO2 29 11/29/2014 0842   CO2 32 07/06/2010 1236   BUN 13 11/29/2014 0842   BUN 16 07/06/2010 1236   CREATININE 1.03 11/29/2014 0842   CREATININE 1.06 02/05/2011 1458   GLUCOSE 100* 11/29/2014 0842   GLUCOSE 110 07/06/2010 1236   CALCIUM 9.3 11/29/2014 0842   CALCIUM 9.0 07/06/2010 1236   Hepatic  Function Latest Ref Rng 11/29/2014 02/22/2011 02/05/2011  Total Protein 6.0 - 8.3 g/dL 6.2 7.1 7.2  Albumin 3.5 - 5.2 g/dL 4.2 3.8 4.6  AST 0 - 37 U/L _0 ALT 0 - 53 U/L _1 Alk Phosphatase 39 - 117 U/L 88 80 85  Total Bilirubin 0.2 - 1.2 mg/dL 0.5 0.2(L) 0.3  Bilirubin, Direct 0.0 - 0.3 mg/dL 0.1 - -    No results found for: TSH Lab Results  Component Value Date   PSA 0.61 11/29/2014   PSA 0.60 10/01/2009    Assessment and Plan:   Healthcare maintenance  Special screening for malignant neoplasms, colon - Plan: Ambulatory referral to Gastroenterology  Health Maintenance Exam: The patient's preventative maintenance and recommended screening tests for an annual wellness exam were reviewed in full today. Brought up to date unless services declined.  Counselled on the importance of diet, exercise, and its role in overall health and mortality. The patient's FH and SH was reviewed, including their home life, tobacco status, and drug and alcohol status.  He has routine f/u  with Heme/Onc regarding his leukemia.  Follow-up: No Follow-up on file. Unless noted, follow-up in 1 year for Health Maintenance Exam.  New Prescriptions   No medications on file   Orders Placed This Encounter  Procedures  . Ambulatory referral to Gastroenterology    Signed,  Frederico Hamman T. Christianne Zacher, MD   Patient's Medications  New Prescriptions   No medications on file  Previous Medications   HYDROCORTISONE-PRAMOXINE (ANALPRAM-HC) 2.5-1 % RECTAL CREAM    Place 1 application rectally 3 (three) times daily.   ZOLPIDEM (AMBIEN) 10 MG TABLET    TAKE 1 TABLET BY MOUTH ONCE A DAY AT BEDTIME AS NEEDED FOR SLEEP  Modified Medications   No medications on file  Discontinued Medications   TRAMADOL HCL 50 MG TBDP    Take 50 mg by mouth every 6 (six) hours as needed (take for pain with sedation caution).

## 2014-12-12 ENCOUNTER — Other Ambulatory Visit: Payer: BC Managed Care – PPO

## 2014-12-19 ENCOUNTER — Encounter: Payer: BC Managed Care – PPO | Admitting: Family Medicine

## 2014-12-20 ENCOUNTER — Ambulatory Visit (INDEPENDENT_AMBULATORY_CARE_PROVIDER_SITE_OTHER): Payer: BC Managed Care – PPO | Admitting: Family Medicine

## 2014-12-20 ENCOUNTER — Encounter: Payer: Self-pay | Admitting: Family Medicine

## 2014-12-20 VITALS — BP 108/74 | HR 72 | Temp 98.3°F | Wt 168.5 lb

## 2014-12-20 DIAGNOSIS — J0100 Acute maxillary sinusitis, unspecified: Secondary | ICD-10-CM | POA: Diagnosis not present

## 2014-12-20 MED ORDER — AMOXICILLIN-POT CLAVULANATE 875-125 MG PO TABS: 1.0000 | ORAL_TABLET | Freq: Two times a day (BID) | ORAL | Status: DC

## 2014-12-20 NOTE — Progress Notes (Signed)
Pre visit review using our clinic review tool, if applicable. No additional management support is needed unless otherwise documented below in the visit note.  H/o CLL at baseline.  Not on treatment.    Sinus pain, pressure, stuffy.  Going on for about 1 month. No fevers.  Not SOB.  Hot shower helps.  He doesn't usually have trouble with allergies.  No cough.  No ST.  Some ear pressure occ.    Meds, vitals, and allergies reviewed.   ROS: See HPI.  Otherwise, noncontributory.  GEN: nad, alert and oriented HEENT: mucous membranes moist, tm w/o erythema, nasal exam w/o erythema, clear discharge noted,  OP with cobblestoning, L nostril with old blood noted, not bleeding currently.  Max sinuses ttp B NECK: supple w/o LA CV: rrr.   PULM: ctab, no inc wob EXT: no edema SKIN: no acute rash

## 2014-12-20 NOTE — Patient Instructions (Signed)
Start augmentin and try to get some rest.  Drink plenty of fluids.   Take ibuprofen for the pain (with food).   Take care.  Glad to see you.

## 2014-12-22 DIAGNOSIS — J0100 Acute maxillary sinusitis, unspecified: Secondary | ICD-10-CM | POA: Insufficient documentation

## 2014-12-22 NOTE — Assessment & Plan Note (Signed)
Nontoxic, start augmentin, f/u prn.  See AVS.  Pt agrees. Okay for outpatient f/u.

## 2015-01-10 ENCOUNTER — Encounter: Payer: Self-pay | Admitting: Gastroenterology

## 2015-02-24 ENCOUNTER — Other Ambulatory Visit: Payer: Self-pay | Admitting: Family Medicine

## 2015-02-24 NOTE — Telephone Encounter (Signed)
Ok to ref #30, 2 ref 

## 2015-02-24 NOTE — Telephone Encounter (Signed)
Last office visit 12/20/2014 with Dr. Damita Dunnings.  Last refilled 11/25/2014 for #30 with 1 refills.  Ok to refill?

## 2015-02-24 NOTE — Telephone Encounter (Signed)
Called into [facility].

## 2015-03-06 ENCOUNTER — Ambulatory Visit (INDEPENDENT_AMBULATORY_CARE_PROVIDER_SITE_OTHER): Payer: BC Managed Care – PPO

## 2015-03-06 DIAGNOSIS — Z23 Encounter for immunization: Secondary | ICD-10-CM | POA: Diagnosis not present

## 2015-03-21 ENCOUNTER — Inpatient Hospital Stay: Payer: BC Managed Care – PPO

## 2015-03-21 ENCOUNTER — Inpatient Hospital Stay: Payer: BC Managed Care – PPO | Admitting: Internal Medicine

## 2015-04-02 ENCOUNTER — Other Ambulatory Visit: Payer: Self-pay | Admitting: *Deleted

## 2015-04-02 ENCOUNTER — Encounter: Payer: BC Managed Care – PPO | Admitting: Gastroenterology

## 2015-04-02 ENCOUNTER — Encounter: Payer: Self-pay | Admitting: *Deleted

## 2015-04-02 DIAGNOSIS — IMO0001 Reserved for inherently not codable concepts without codable children: Secondary | ICD-10-CM

## 2015-04-03 ENCOUNTER — Inpatient Hospital Stay: Payer: BC Managed Care – PPO | Admitting: Internal Medicine

## 2015-04-03 ENCOUNTER — Inpatient Hospital Stay: Payer: BC Managed Care – PPO | Attending: Internal Medicine

## 2015-04-11 ENCOUNTER — Inpatient Hospital Stay: Payer: BC Managed Care – PPO | Admitting: Internal Medicine

## 2015-04-11 ENCOUNTER — Inpatient Hospital Stay: Payer: BC Managed Care – PPO

## 2015-04-30 ENCOUNTER — Encounter: Payer: Self-pay | Admitting: *Deleted

## 2015-04-30 ENCOUNTER — Inpatient Hospital Stay: Payer: BC Managed Care – PPO | Admitting: Internal Medicine

## 2015-04-30 ENCOUNTER — Inpatient Hospital Stay: Payer: BC Managed Care – PPO

## 2015-05-19 ENCOUNTER — Other Ambulatory Visit: Payer: BC Managed Care – PPO

## 2015-05-19 ENCOUNTER — Ambulatory Visit: Payer: BC Managed Care – PPO | Admitting: Internal Medicine

## 2015-07-23 ENCOUNTER — Other Ambulatory Visit: Payer: Self-pay | Admitting: *Deleted

## 2015-07-23 MED ORDER — ZOLPIDEM TARTRATE 10 MG PO TABS: ORAL_TABLET | ORAL | Status: DC

## 2015-07-23 NOTE — Telephone Encounter (Signed)
Zolpidem called into [facility].

## 2015-07-23 NOTE — Telephone Encounter (Signed)
Last office visit 12/20/2014 with Dr. Damita Dunnings.  Last refilled 02/24/2015 for #30 with 2 refills.  Ok to refill?

## 2015-07-23 NOTE — Telephone Encounter (Signed)
Ok, 30, 2 ref 

## 2015-10-29 ENCOUNTER — Other Ambulatory Visit: Payer: Self-pay | Admitting: *Deleted

## 2015-10-29 NOTE — Telephone Encounter (Signed)
Last office visit 12/20/2014 with Dr. Damita Dunnings for Sinusitis.  Last refilled 07/23/2015 for #30 with 2 refills.  Ok to refill?

## 2015-10-30 MED ORDER — ZOLPIDEM TARTRATE 10 MG PO TABS: ORAL_TABLET | ORAL | Status: DC

## 2015-10-30 NOTE — Telephone Encounter (Signed)
Zolpidem called into [facility].  Gerald Stabs, pharmacist states he is not due for refill until 11/05/15.  Advised to refill on 11/05/15.  Also ask that they let patient know he needs to call the office and schedule CPE with Dr. Lorelei Pont. Please schedule CPE with fasting labs prior for Dr. Lorelei Pont.

## 2015-10-30 NOTE — Telephone Encounter (Signed)
Ok to refill 30, 2 ref  F/u CPX with me

## 2015-10-31 NOTE — Telephone Encounter (Signed)
CPE scheduled for 12/10/2015 at 2:45 pm with Dr. Lorelei Pont.

## 2015-11-28 ENCOUNTER — Other Ambulatory Visit: Payer: Self-pay | Admitting: Family Medicine

## 2015-11-28 DIAGNOSIS — Z114 Encounter for screening for human immunodeficiency virus [HIV]: Secondary | ICD-10-CM

## 2015-11-28 DIAGNOSIS — Z79899 Other long term (current) drug therapy: Secondary | ICD-10-CM

## 2015-11-28 DIAGNOSIS — Z13220 Encounter for screening for lipoid disorders: Secondary | ICD-10-CM

## 2015-11-28 DIAGNOSIS — Z125 Encounter for screening for malignant neoplasm of prostate: Secondary | ICD-10-CM

## 2015-12-03 ENCOUNTER — Other Ambulatory Visit (INDEPENDENT_AMBULATORY_CARE_PROVIDER_SITE_OTHER): Payer: BLUE CROSS/BLUE SHIELD

## 2015-12-03 DIAGNOSIS — Z79899 Other long term (current) drug therapy: Secondary | ICD-10-CM | POA: Diagnosis not present

## 2015-12-03 DIAGNOSIS — Z13220 Encounter for screening for lipoid disorders: Secondary | ICD-10-CM

## 2015-12-03 DIAGNOSIS — Z114 Encounter for screening for human immunodeficiency virus [HIV]: Secondary | ICD-10-CM

## 2015-12-03 DIAGNOSIS — Z125 Encounter for screening for malignant neoplasm of prostate: Secondary | ICD-10-CM | POA: Diagnosis not present

## 2015-12-03 LAB — HEPATIC FUNCTION PANEL
ALT: 22 U/L (ref 0–53)
AST: 33 U/L (ref 0–37)
Albumin: 4.7 g/dL (ref 3.5–5.2)
Alkaline Phosphatase: 61 U/L (ref 39–117)
BILIRUBIN DIRECT: 0.1 mg/dL (ref 0.0–0.3)
TOTAL PROTEIN: 7 g/dL (ref 6.0–8.3)
Total Bilirubin: 0.7 mg/dL (ref 0.2–1.2)

## 2015-12-03 LAB — LIPID PANEL
CHOL/HDL RATIO: 4
Cholesterol: 242 mg/dL — ABNORMAL HIGH (ref 0–200)
HDL: 66.6 mg/dL (ref >39.00)
LDL CALC: 159 mg/dL — AB (ref 0–99)
NONHDL: 175.12
TRIGLYCERIDES: 81 mg/dL (ref 0.0–149.0)
VLDL: 16.2 mg/dL (ref 0.0–40.0)

## 2015-12-03 LAB — CBC WITH DIFFERENTIAL/PLATELET
BASOS PCT: 0.1 % (ref 0.0–3.0)
Basophils Absolute: 0 10*3/uL (ref 0.0–0.1)
Eosinophils Absolute: 0.2 10*3/uL (ref 0.0–0.7)
Eosinophils Relative: 1.3 % (ref 0.0–5.0)
HCT: 46.8 % (ref 39.0–52.0)
Hemoglobin: 15.5 g/dL (ref 13.0–17.0)
LYMPHS ABS: 10.7 10*3/uL — AB (ref 0.7–4.0)
Lymphocytes Relative: 60.5 % — ABNORMAL HIGH (ref 12.0–46.0)
MCHC: 33.1 g/dL (ref 30.0–36.0)
MCV: 84.7 fl (ref 78.0–100.0)
MONOS PCT: 3.1 % (ref 3.0–12.0)
Monocytes Absolute: 0.6 10*3/uL (ref 0.1–1.0)
Neutro Abs: 6.2 10*3/uL (ref 1.4–7.7)
Neutrophils Relative %: 35 % — ABNORMAL LOW (ref 43.0–77.0)
Platelets: 309 10*3/uL (ref 150.0–400.0)
RBC: 5.52 Mil/uL (ref 4.22–5.81)
RDW: 15.3 % (ref 11.5–15.5)
WBC: 17.8 10*3/uL — ABNORMAL HIGH (ref 4.0–10.5)

## 2015-12-03 LAB — BASIC METABOLIC PANEL
BUN: 13 mg/dL (ref 6–23)
CHLORIDE: 105 meq/L (ref 96–112)
CO2: 31 mEq/L (ref 19–32)
Calcium: 10.1 mg/dL (ref 8.4–10.5)
Creatinine, Ser: 1.17 mg/dL (ref 0.40–1.50)
GFR: 68.11 mL/min (ref >60.00)
GLUCOSE: 92 mg/dL (ref 70–99)
POTASSIUM: 4.3 meq/L (ref 3.5–5.1)
SODIUM: 143 meq/L (ref 135–145)

## 2015-12-03 LAB — PSA: PSA: 0.58 ng/mL (ref 0.10–4.00)

## 2015-12-04 LAB — HIV ANTIBODY (ROUTINE TESTING W REFLEX): HIV 1&2 Ab, 4th Generation: NONREACTIVE

## 2015-12-10 ENCOUNTER — Telehealth: Payer: Self-pay

## 2015-12-10 ENCOUNTER — Ambulatory Visit (INDEPENDENT_AMBULATORY_CARE_PROVIDER_SITE_OTHER): Payer: BLUE CROSS/BLUE SHIELD | Admitting: Family Medicine

## 2015-12-10 ENCOUNTER — Encounter: Payer: Self-pay | Admitting: Family Medicine

## 2015-12-10 VITALS — BP 126/82 | HR 70 | Temp 97.3°F | Ht 65.75 in | Wt 163.5 lb

## 2015-12-10 DIAGNOSIS — Z0000 Encounter for general adult medical examination without abnormal findings: Secondary | ICD-10-CM | POA: Diagnosis not present

## 2015-12-10 MED ORDER — SILDENAFIL CITRATE 100 MG PO TABS: 50.0000 mg | ORAL_TABLET | ORAL | Status: DC | PRN

## 2015-12-10 NOTE — Telephone Encounter (Signed)
Yes.   Generic Revatio / Sildanefil 20 mg. 1 - 5 tabs 30 mins prior to intercourse. #30, 11 refills.  Make sure he understands this is cash / credit card, not to be run through insurance.

## 2015-12-10 NOTE — Telephone Encounter (Signed)
Philip Tran with [facility] left v/m; pts insurance does not cover the sildenafil, Viagra 100 mg; too expensive for pt; Gerald Stabs wants to know if new rx could be sent for sildenafil 20 mg, Revatio to [facility]; cost to pt would be $50.00 for # 30 tabs.Please advise.

## 2015-12-10 NOTE — Progress Notes (Signed)
Dr. Frederico Hamman T. Gowri Suchan, MD, [facility] [place], [address] Phone: [phone] Fax: [phone]  12/10/2015  Patient: Philip Tran, MRN: [id number], DOB: Aug 17, 1955, 60 y.o.  Primary Physician:  Owens Loffler, MD   Chief Complaint  Patient presents with  . Annual Exam   Subjective:   Philip Tran is a 60 y.o. pleasant patient who presents with the following:  Preventative Health Maintenance Visit:  Health Maintenance Summary Reviewed and updated, unless pt declines services.  Tobacco History Reviewed. Alcohol: No concerns, no excessive use Exercise Habits: working out a lot STD concerns: no risk or activity to increase risk Drug Use: None Encouraged self-testicular check  Colon - reminder. He will call to set up. CLL !  Health Maintenance  Topic Date Due  . COLONOSCOPY  02/26/2008  . INFLUENZA VACCINE  01/13/2016  . TETANUS/TDAP  05/06/2021  . Hepatitis C Screening  Completed  . HIV Screening  Completed   Immunization History  Administered Date(s) Administered  . Influenza Split 03/03/2011  . Influenza Whole 04/12/2008, 03/12/2009  . Influenza,inj,Quad PF,36+ Mos 01/31/2014, 03/06/2015  . Tdap 05/07/2011   Patient Active Problem List   Diagnosis Date Noted  . Chronic lymphoblastic leukemia ([place]) 03/03/2011    Priority: High  . HEPATITIS C, CHRONIC 12/10/2009    Priority: High  . Acute hemorrhoid 03/08/2014  . 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 Rheumatoid arthritis(714.0) 03/05/2011   Past Medical History  Diagnosis Date  . 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 Humphrey Rolls  . Hepatitis C antibody test positive     Chronic Hep C  . Hepatitis B infection     Prior but patient not chronically infected  . 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 Hemorrhoid   . Rheumatoid arthritis [facility])    Past  Surgical History  Procedure Laterality Date  . Total hip arthroplasty Right 2014    [facility]  . Hernia repair     Social History   Social History  . Marital Status: Married    Spouse Name: N/A  . Number of Children: N/A  . Years of Education: N/A   Occupational History  . Not on file.   Social History Main Topics  . Smoking status: Former Smoker -- 2.00 packs/day for 20 years    Types: Cigarettes    Quit date: 07/07/2006  . Smokeless tobacco: Never Used  . Alcohol Use: 0.0 oz/week    0 Standard drinks or equivalent per week     Comment: occ  . Drug Use: No  . Sexual Activity: Not on file   Other Topics Concern  . Not on file   Social History Narrative   Avid basketball player, golfer, softball player.   History of remote IV drug use, clean for many years now.   No family history on file. No Known Allergies  Medication list has been reviewed and updated.   General: Denies fever, chills, sweats. No significant weight loss. Eyes: Denies blurring,significant itching ENT: Denies earache, sore throat, and hoarseness. Cardiovascular: Denies chest pains, palpitations, dyspnea on exertion Respiratory: Denies cough, dyspnea at rest,wheeezing Breast: no concerns about lumps GI: Denies nausea, vomiting, diarrhea, constipation, change in bowel habits, abdominal pain, melena, hematochezia GU:  Some occ ed Musculoskeletal: Denies back pain, joint pain Derm: Denies rash, itching Neuro: Denies  paresthesias, frequent falls, frequent headaches  Psych: Denies depression, anxiety Endocrine: Denies cold intolerance, heat intolerance, polydipsia Heme: Denies enlarged lymph nodes Allergy: No hayfever  Objective:   BP 126/82 mmHg  Pulse 70  Temp(Src) 97.3 F (36.3 C) (Oral)  Ht 5' 5.75" (1.67 m)  Wt 163 lb 8 oz (74.163 kg)  BMI 26.59 kg/m2  SpO2 95% Ideal Body Weight: Weight in (lb) to have BMI = 25: 153.4  No exam data present  GEN: well developed, well  nourished, no acute distress Eyes: conjunctiva and lids normal, PERRLA, EOMI ENT: TM clear, nares clear, oral exam WNL Neck: supple, no lymphadenopathy, no thyromegaly, no JVD Pulm: clear to auscultation and percussion, respiratory effort normal CV: regular rate and rhythm, S1-S2, no murmur, rub or gallop, no bruits, peripheral pulses normal and symmetric, no cyanosis, clubbing, edema or varicosities GI: soft, non-tender; no hepatosplenomegaly, masses; active bowel sounds all quadrants GU: no hernia, testicular mass, penile discharge Lymph: no cervical, axillary or inguinal adenopathy MSK: gait normal, muscle tone and strength WNL, no joint swelling, effusions, discoloration, crepitus  SKIN: clear, good turgor, color WNL, no rashes, lesions, or ulcerations Neuro: normal mental status, normal strength, sensation, and motion Psych: alert; oriented to person, place and time, normally interactive and not anxious or depressed in appearance.  All labs reviewed with patient.  Lipids:    Component Value Date/Time   CHOL 242* 12/03/2015 1535   TRIG 81.0 12/03/2015 1535   HDL 66.60 12/03/2015 1535   VLDL 16.2 12/03/2015 1535   CHOLHDL 4 12/03/2015 1535   CBC: CBC Latest Ref Rng 12/03/2015 11/29/2014 03/08/2014  WBC 4.0 - 10.5 K/uL 17.8(H) 16.1(H) 13.1(H)  Hemoglobin 13.0 - 17.0 g/dL 15.5 15.4 15.2  Hematocrit 39.0 - 52.0 % 46.8 46.8 46.6  Platelets 150.0 - 400.0 K/uL 309.0 303.0 382.5    Basic Metabolic Panel:    Component Value Date/Time   NA 143 12/03/2015 1535   NA 144 07/06/2010 1236   K 4.3 12/03/2015 1535   K 4.1 07/06/2010 1236   CL 105 12/03/2015 1535   CL 101 07/06/2010 1236   CO2 31 12/03/2015 1535   CO2 32 07/06/2010 1236   BUN 13 12/03/2015 1535   BUN 16 07/06/2010 1236   CREATININE 1.17 12/03/2015 1535   CREATININE 1.06 02/05/2011 1458   GLUCOSE 92 12/03/2015 1535   GLUCOSE 110 07/06/2010 1236   CALCIUM 10.1 12/03/2015 1535   CALCIUM 9.0 07/06/2010 1236   Hepatic  Function Latest Ref Rng 12/03/2015 11/29/2014 02/22/2011  Total Protein 6.0 - 8.3 g/dL 7.0 6.2 7.1  Albumin 3.5 - 5.2 g/dL 4.7 4.2 3.8  AST 0 - 37 U/L 33 22 26  ALT 0 - 53 U/L _0 Alk Phosphatase 39 - 117 U/L 61 88 80  Total Bilirubin 0.2 - 1.2 mg/dL 0.7 0.5 0.2(L)  Bilirubin, Direct 0.0 - 0.3 mg/dL 0.1 0.1 -    No results found for: TSH Lab Results  Component Value Date   PSA 0.58 12/03/2015   PSA 0.61 11/29/2014   PSA 0.60 10/01/2009    Assessment and Plan:   Healthcare maintenance   Health Maintenance Exam: The patient's preventative maintenance and recommended screening tests for an annual wellness exam were reviewed in full today. Brought up to date unless services declined.  Counselled on the importance of diet, exercise, and its role in overall health and mortality. The patient's FH and SH was reviewed, including their home life, tobacco status, and drug and alcohol status.  Follow-up: No Follow-up  on file. Unless noted, follow-up in 1 year for Health Maintenance Exam.  New Prescriptions   SILDENAFIL (VIAGRA) 100 MG TABLET    Take 0.5-1 tablets (50-100 mg total) by mouth as needed for erectile dysfunction.   Signed,  Maud Deed. Antwane Grose, MD   Patient's Medications  New Prescriptions   SILDENAFIL (VIAGRA) 100 MG TABLET    Take 0.5-1 tablets (50-100 mg total) by mouth as needed for erectile dysfunction.  Previous Medications   HYDROCORTISONE-PRAMOXINE (ANALPRAM-HC) 2.5-1 % RECTAL CREAM    Place 1 application rectally 3 (three) times daily.   ZOLPIDEM (AMBIEN) 10 MG TABLET    TAKE 1 TABLET BY MOUTH ONCE A DAY AT BEDTIME AS NEEDED FOR SLEEP  Modified Medications   No medications on file  Discontinued Medications   AMOXICILLIN-CLAVULANATE (AUGMENTIN) 875-125 MG PER TABLET    Take 1 tablet by mouth 2 (two) times daily.

## 2015-12-10 NOTE — Progress Notes (Signed)
Pre visit review using our clinic review tool, if applicable. No additional management support is needed unless otherwise documented below in the visit note. 

## 2015-12-11 MED ORDER — SILDENAFIL CITRATE 20 MG PO TABS: ORAL_TABLET | ORAL | Status: DC

## 2016-02-02 ENCOUNTER — Other Ambulatory Visit: Payer: Self-pay | Admitting: *Deleted

## 2016-02-02 MED ORDER — ZOLPIDEM TARTRATE 10 MG PO TABS: ORAL_TABLET | ORAL | 2 refills | Status: DC

## 2016-02-02 NOTE — Telephone Encounter (Signed)
Ok, 30, 3 ref 

## 2016-02-02 NOTE — Telephone Encounter (Signed)
Zolpidem called into [facility].

## 2016-02-02 NOTE — Telephone Encounter (Signed)
Last office visit 12/10/2015.  Last refilled 10/30/2015 for #30 with 2 refills.  Ok to refill?

## 2016-03-05 ENCOUNTER — Ambulatory Visit (INDEPENDENT_AMBULATORY_CARE_PROVIDER_SITE_OTHER): Payer: BLUE CROSS/BLUE SHIELD

## 2016-03-05 DIAGNOSIS — Z23 Encounter for immunization: Secondary | ICD-10-CM

## 2016-05-12 ENCOUNTER — Ambulatory Visit (INDEPENDENT_AMBULATORY_CARE_PROVIDER_SITE_OTHER): Payer: BLUE CROSS/BLUE SHIELD | Admitting: Family Medicine

## 2016-05-12 ENCOUNTER — Encounter: Payer: Self-pay | Admitting: Family Medicine

## 2016-05-12 VITALS — BP 133/79 | HR 78 | Temp 98.5°F | Ht 65.75 in | Wt 159.8 lb

## 2016-05-12 DIAGNOSIS — M25562 Pain in left knee: Secondary | ICD-10-CM

## 2016-05-12 DIAGNOSIS — M7122 Synovial cyst of popliteal space [Baker], left knee: Secondary | ICD-10-CM

## 2016-05-12 MED ORDER — METHYLPREDNISOLONE ACETATE 40 MG/ML IJ SUSP
80.0000 mg | Freq: Once | INTRAMUSCULAR | Status: AC
  Administered 2016-05-12: 80 mg via INTRA_ARTICULAR

## 2016-05-12 NOTE — Progress Notes (Signed)
Pre visit review using our clinic review tool, if applicable. No additional management support is needed unless otherwise documented below in the visit note. 

## 2016-05-12 NOTE — Progress Notes (Signed)
Dr. Frederico Hamman T. Zeplin Aleshire, MD, [facility] [place], [address] Phone: [phone] Fax: [phone]  05/12/2016  Patient: Philip Tran, MRN: [id number], DOB: 1956/03/17, 60 y.o.  Primary Physician:  Owens Loffler, MD   Chief Complaint  Patient presents with  . Knee Pain    Back of Left knee   Subjective:   Philip Tran is a 60 y.o. very pleasant male patient who presents with the following:  Swelling in the back. Ongoing for a month or more. He denies any specific known injury.  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  He has had some swelling primarily posteriorly and has pain in the posterior lateral aspect of the knee.  He has not had any acute muscle injury that he knows and has not been any bruising or swelling aside from the posterior of the knee.  No significant effusion currently.  No significant surgical history of this knee known.  He is had some isolated numbness in the posterior aspect of the calf more caudal.  None in the back, thigh, or distal to this.  Driving a transfer truck: Philip Tran.   Past Medical History, Surgical History, Social History, Family History, Problem List, Medications, and Allergies have been reviewed and updated if relevant.  Patient Active Problem List   Diagnosis Date Noted  . 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 HEPATITIS C, CHRONIC 12/10/2009    Priority: High  . Acute hemorrhoid 03/08/2014  . 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 Rheumatoid arthritis(714.0) 03/05/2011    Past Medical History:  Diagnosis Date  . 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 Humphrey Rolls  . Hemorrhoid   . Hepatitis B infection    Prior but patient not chronically infected  . Hepatitis C antibody test positive    Chronic Hep C  .  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 Rheumatoid arthritis [facility])     Past Surgical History:  Procedure Laterality Date  . HERNIA REPAIR    . TOTAL HIP ARTHROPLASTY Right 2014   [facility]    Social History   Social History  . Marital status: Married    Spouse name: N/A  . Number of children: N/A  . Years of education: N/A   Occupational History  . Not on file.   Social History Main Topics  . Smoking status: Former Smoker    Packs/day: 2.00    Years: 20.00    Types: Cigarettes    Quit date: 07/07/2006  . Smokeless tobacco: Never Used  . Alcohol use 0.0 oz/week     Comment: occ  . Drug use: No  . Sexual activity: Not on file   Other Topics Concern  . Not on file   Social History Narrative   Avid basketball player, golfer, softball player.   History of remote IV drug use, clean for many years now.    No family history on file.  No Known Allergies  Medication list reviewed and updated in full in [place].  GEN: No fevers, chills. Nontoxic. Primarily MSK c/o today. MSK: Detailed in the HPI GI: tolerating PO intake without difficulty Neuro: No numbness, parasthesias, or tingling associated. Otherwise the pertinent positives of the ROS are noted above.   Objective:   BP 133/79  Pulse 78   Temp 98.5 F (36.9 C) (Oral)   Ht 5' 5.75" (1.67 m)   Wt 159 lb 12 oz (72.5 kg)   BMI 25.98 kg/m    GEN: WDWN, NAD, Non-toxic, Alert & Oriented x 3 HEENT: Atraumatic, Normocephalic.  Ears and Nose: No external deformity. EXTR: No clubbing/cyanosis/edema NEURO: Normal gait.  PSYCH: Normally interactive. Conversant. Not depressed or anxious appearing.  Calm demeanor.   Knee:  L Gait: Normal heel toe pattern ROM: 0-125 Effusion: post swelling Echymosis or edema: none Patellar tendon NT Painful PLICA: neg Patellar grind: negative Medial and lateral patellar facet loading: negative medial and lateral joint lines: postero-lateral joint  line tenderness Mcmurray's neg Flexion-pinch pain Varus and valgus stress: stable Lachman: neg Ant and Post drawer: neg Hip abduction, IR, ER: WNL Hip flexion str: 5/5 Hip abd: 5/5 Quad: 5/5 VMO atrophy:No Hamstring concentric and eccentric: 5/5   Radiology: No results found.   Assessment and Plan:   Acute pain of left knee - Plan: methylPREDNISolone acetate (DEPO-MEDROL) injection 80 mg  Baker's cyst of knee, left  Baker cyst with probable internal derangement, posterior lateral meniscal injury certainly possible based on exam.  For now the trial of conservative management.  Numbness likely secondary to peripheral nerve, more likely secondary to swelling.  Modification of activities, injection of knee with corticosteroid.  Follow-up if symptoms persist in 3-4 weeks.  Knee Injection, L Patient verbally consented to procedure. Risks (including potential rare risk of infection), benefits, and alternatives explained. Sterilely prepped with Chloraprep. Ethyl cholride used for anesthesia. 8 cc Lidocaine 1% mixed with 2 mL Depo-Medrol 40 mg injected using the anteromedial approach without difficulty. No complications with procedure and tolerated well. Patient had decreased pain post-injection.   Follow-up: No Follow-up on file.  Meds ordered this encounter  Medications  . methylPREDNISolone acetate (DEPO-MEDROL) injection 80 mg   Medications Discontinued During This Encounter  Medication Reason  . sildenafil (VIAGRA) 100 MG tablet Change in therapy   No orders of the defined types were placed in this encounter.   Signed,  Maud Deed. Madelyn Tlatelpa, MD     Medication List       Accurate as of 05/12/16 11:59 PM. Always use your most recent med list.          hydrocortisone-pramoxine 2.5-1 % rectal cream Commonly known as:  ANALPRAM-HC Place 1 application rectally 3 (three) times daily.   sildenafil 20 MG tablet Commonly known as:  REVATIO Take 1 to 5 tablets by mouth  30 minutes prior to intercourse.   zolpidem 10 MG tablet Commonly known as:  AMBIEN TAKE 1 TABLET BY MOUTH ONCE A DAY AT BEDTIME AS NEEDED FOR SLEEP

## 2016-05-28 ENCOUNTER — Other Ambulatory Visit: Payer: Self-pay | Admitting: Family Medicine

## 2016-05-28 NOTE — Telephone Encounter (Signed)
Last office visit 05/12/2016.  Last refilled 02/02/2016 for #30 with 2 refills.  Ok to refill?

## 2016-05-28 NOTE — Telephone Encounter (Signed)
Zolpidem called into [facility] [place] - [address] Phone: [phone]  

## 2016-05-28 NOTE — Telephone Encounter (Signed)
Ok to refill 30, 2 ref

## 2016-06-01 ENCOUNTER — Other Ambulatory Visit: Payer: Self-pay | Admitting: Family Medicine

## 2016-06-02 ENCOUNTER — Telehealth: Payer: Self-pay | Admitting: *Deleted

## 2016-06-02 NOTE — Telephone Encounter (Signed)
Received fax from [facility] requesting PA for Analapram.  PA completed on CoverMyMeds.  Sent for review.  Can take up to 72 hours to receive a response.

## 2016-06-02 NOTE — Telephone Encounter (Signed)
Last office vsiit 05/12/2016.  Last refilled 03/08/2014 for 30 g with no refills.  Ok to refill?

## 2016-06-15 NOTE — Telephone Encounter (Signed)
PA denied.  Reason for denial: The requested service is not a covered benefit per your benefit booklet or plan documents.  [facility] notified via fax.

## 2016-08-09 ENCOUNTER — Telehealth: Payer: Self-pay | Admitting: Family Medicine

## 2016-08-09 NOTE — Telephone Encounter (Signed)
[facility]  Patient Name: Philip Tran  DOB: 1955/11/12    Initial Comment Caller has the flu and wants Tamiflu. Caller has headache, stomach upset, and diarrhea   Nurse Assessment  Nurse: Zorita Pang, RN, Deborah Date/Time (Eastern Time): 08/09/2016 5:35:49 PM  Confirm and document reason for call. If symptomatic, describe symptoms. ---The caller states that he began having diarrhea and abdominal cramping. He states that he is having a fever of 100 today. He is also having body aches.  Does the patient have any new or worsening symptoms? ---Yes  Will a triage be completed? ---Yes  Related visit to physician within the last 2 weeks? ---No  Does the PT have any chronic conditions? (i.e. diabetes, asthma, etc.) ---Yes  List chronic conditions. ---CLL and Hepatits C (treated)  Is this a behavioral health or substance abuse call? ---No     Guidelines    Guideline Title Affirmed Question Affirmed Notes  Headache [1] Sinus pain of forehead AND [2] yellow or green nasal discharge    Final Disposition User   See Physician within 24 Hours Womble, RN, Neoma Laming    Comments  The caller was wanting Tamiflu called in but it is not on the standing orders list. This nurse offered to see if there was an availability to see his MD tomorrow and he stated that he preferred to call the office tomorrow. The patient was instructed to call back if his symptoms get worse or if he has new symptoms and he verbalizes understanding. (The patient states that cost is the main factor).  NOTE; Nurse Zorita Pang did triage on patient; refused appt will call in am Nurse Wynetta Emery is documenting in epic for Nurse Womble at this time.   Referrals  GO TO FACILITY UNDECIDED   Disagree/Comply: Comply

## 2016-08-10 MED ORDER — OSELTAMIVIR PHOSPHATE 75 MG PO CAPS: 75.0000 mg | ORAL_CAPSULE | Freq: Two times a day (BID) | ORAL | 0 refills | Status: DC

## 2016-08-10 NOTE — Telephone Encounter (Signed)
I would rather him not come to the office - probable flu.  Tamiflu 75 mg, 1 po bid, #10, 0 refills

## 2016-08-10 NOTE — Telephone Encounter (Signed)
Left message for Philip Tran that Dr. Lorelei Pont is going to send in a prescription for Tamiflu to [facility].

## 2016-08-11 NOTE — Telephone Encounter (Signed)
Can you talk to him about influenza. He will feel poorly for at least 7-10 days.   He can return to work when he has been afebrile for 24 hours. I am ok with giving him a note saying such.

## 2016-08-11 NOTE — Telephone Encounter (Signed)
Mr. Laubenstein notified as instructed by telephone.  Work note written to excuse him from work from 08/10/2016 through 08/13/2016.  Letter left up front for patient to pick up.

## 2016-08-11 NOTE — Telephone Encounter (Signed)
Pt still not feeling good; had temp this morning of 99; body is still aching,also continues with h/a;diarrhea has stopped and pt wants to know when he would be able to return to work; pt does not work on weekends. Pt request note to be out of work from 08/10/16 - whenever Dr Lorelei Pont thinks can return to work. Pt does not think he will be able to return on 08/12/16 but maybe 08/13/16 depending on what Dr Lorelei Pont thinks. Pt request cb.

## 2016-08-27 ENCOUNTER — Other Ambulatory Visit: Payer: Self-pay | Admitting: Family Medicine

## 2016-08-29 NOTE — Telephone Encounter (Signed)
Last office visit 05/12/16.  Last refilled 05/28/16 for #30 with 2 refills.  Ok to refill?

## 2016-08-30 NOTE — Telephone Encounter (Signed)
Ok, 30, 5 ref 

## 2016-08-30 NOTE — Telephone Encounter (Signed)
Zolpidem called into [facility] [place] - [address] Phone: [phone]  

## 2017-01-15 ENCOUNTER — Other Ambulatory Visit: Payer: Self-pay | Admitting: Family Medicine

## 2017-02-22 ENCOUNTER — Other Ambulatory Visit: Payer: Self-pay | Admitting: Family Medicine

## 2017-02-22 NOTE — Telephone Encounter (Signed)
Last office visit 05/12/2016.  Last refilled 08/30/2016 for #30 with 5 refills.  Ok to refill?

## 2017-02-23 NOTE — Telephone Encounter (Signed)
Ok to ref, 30, 5

## 2017-02-23 NOTE — Telephone Encounter (Signed)
Zolpidem called into [place], [place] [facility] [place] Phone: [phone]

## 2017-04-11 ENCOUNTER — Ambulatory Visit: Payer: BLUE CROSS/BLUE SHIELD | Admitting: Family Medicine

## 2017-04-12 ENCOUNTER — Encounter (INDEPENDENT_AMBULATORY_CARE_PROVIDER_SITE_OTHER): Payer: PRIVATE HEALTH INSURANCE | Admitting: Family Medicine

## 2017-04-12 DIAGNOSIS — Z23 Encounter for immunization: Secondary | ICD-10-CM | POA: Diagnosis not present

## 2017-04-21 ENCOUNTER — Other Ambulatory Visit: Payer: Self-pay

## 2017-04-21 ENCOUNTER — Ambulatory Visit (INDEPENDENT_AMBULATORY_CARE_PROVIDER_SITE_OTHER): Payer: PRIVATE HEALTH INSURANCE | Admitting: Family Medicine

## 2017-04-21 ENCOUNTER — Encounter: Payer: Self-pay | Admitting: Family Medicine

## 2017-04-21 VITALS — BP 104/68 | HR 68 | Temp 98.2°F | Ht 65.5 in | Wt 157.5 lb

## 2017-04-21 DIAGNOSIS — Z0000 Encounter for general adult medical examination without abnormal findings: Secondary | ICD-10-CM

## 2017-04-21 DIAGNOSIS — C91Z0 Other lymphoid leukemia not having achieved remission: Secondary | ICD-10-CM

## 2017-04-21 DIAGNOSIS — B182 Chronic viral hepatitis C: Secondary | ICD-10-CM

## 2017-04-21 DIAGNOSIS — Z125 Encounter for screening for malignant neoplasm of prostate: Secondary | ICD-10-CM | POA: Diagnosis not present

## 2017-04-21 DIAGNOSIS — IMO0001 Reserved for inherently not codable concepts without codable children: Secondary | ICD-10-CM

## 2017-04-21 LAB — CBC WITH DIFFERENTIAL/PLATELET
BASOS PCT: 0.5 % (ref 0.0–3.0)
Basophils Absolute: 0.1 10*3/uL (ref 0.0–0.1)
Eosinophils Absolute: 0.3 10*3/uL (ref 0.0–0.7)
Eosinophils Relative: 1.8 % (ref 0.0–5.0)
HEMATOCRIT: 47.4 % (ref 39.0–52.0)
Hemoglobin: 15.5 g/dL (ref 13.0–17.0)
LYMPHS ABS: 10.1 10*3/uL — AB (ref 0.7–4.0)
Lymphocytes Relative: 63.3 % — ABNORMAL HIGH (ref 12.0–46.0)
MCHC: 32.6 g/dL (ref 30.0–36.0)
MCV: 85.2 fl (ref 78.0–100.0)
MONOS PCT: 4.6 % (ref 3.0–12.0)
Monocytes Absolute: 0.7 10*3/uL (ref 0.1–1.0)
NEUTROS ABS: 4.7 10*3/uL (ref 1.4–7.7)
NEUTROS PCT: 29.8 % — AB (ref 43.0–77.0)
Platelets: 333 10*3/uL (ref 150.0–400.0)
RBC: 5.56 Mil/uL (ref 4.22–5.81)
RDW: 15.1 % (ref 11.5–15.5)
WBC: 15.9 10*3/uL — ABNORMAL HIGH (ref 4.0–10.5)

## 2017-04-21 LAB — HEPATIC FUNCTION PANEL
ALBUMIN: 4.8 g/dL (ref 3.5–5.2)
ALT: 20 U/L (ref 0–53)
AST: 27 U/L (ref 0–37)
Alkaline Phosphatase: 74 U/L (ref 39–117)
Bilirubin, Direct: 0.1 mg/dL (ref 0.0–0.3)
TOTAL PROTEIN: 7 g/dL (ref 6.0–8.3)
Total Bilirubin: 0.6 mg/dL (ref 0.2–1.2)

## 2017-04-21 LAB — LIPID PANEL
Cholesterol: 254 mg/dL — ABNORMAL HIGH (ref 0–200)
HDL: 66.5 mg/dL (ref >39.00)
LDL Cholesterol: 173 mg/dL — ABNORMAL HIGH (ref 0–99)
NonHDL: 187.24
TRIGLYCERIDES: 69 mg/dL (ref 0.0–149.0)
Total CHOL/HDL Ratio: 4
VLDL: 13.8 mg/dL (ref 0.0–40.0)

## 2017-04-21 LAB — BASIC METABOLIC PANEL
BUN: 18 mg/dL (ref 6–23)
CALCIUM: 10.3 mg/dL (ref 8.4–10.5)
CHLORIDE: 103 meq/L (ref 96–112)
CO2: 32 meq/L (ref 19–32)
Creatinine, Ser: 1.18 mg/dL (ref 0.40–1.50)
GFR: 67.12 mL/min (ref >60.00)
GLUCOSE: 91 mg/dL (ref 70–99)
POTASSIUM: 5 meq/L (ref 3.5–5.1)
SODIUM: 145 meq/L (ref 135–145)

## 2017-04-21 LAB — ALKALINE PHOSPHATASE: Alkaline Phosphatase: 74 U/L (ref 39–117)

## 2017-04-21 LAB — PSA: PSA: 0.69 ng/mL (ref 0.10–4.00)

## 2017-04-21 NOTE — Progress Notes (Signed)
Dr. Frederico Hamman T. Zsazsa Bahena, MD, [facility] [place], [address] Phone: [phone] Fax: [phone]  04/21/2017  Patient: Philip Tran, MRN: [id number], DOB: Jun 17, 1955, 61 y.o.  Primary Physician:  Owens Loffler, MD   Chief Complaint  Patient presents with  . Annual Exam   Subjective:   Philip Tran is a 61 y.o. pleasant patient who presents with the following:  Preventative Health Maintenance Visit:  Health Maintenance Summary Reviewed and updated, unless pt declines services.  Tobacco History Reviewed. Alcohol: No concerns, no excessive use Exercise Habits: Some activity, rec at least 30 mins 5 times a week STD concerns: no risk or activity to increase risk Drug Use: None Encouraged self-testicular check  Some joint pains  Last onc visit 09/2015  Health Maintenance  Topic Date Due  . COLONOSCOPY  02/26/2008  . TETANUS/TDAP  05/06/2021  . INFLUENZA VACCINE  Completed  . Hepatitis C Screening  Completed  . HIV Screening  Completed   Immunization History  Administered Date(s) Administered  . Hepatitis A, Adult 09/03/2010, 08/24/2011  . Influenza Split 03/03/2011  . Influenza Whole 04/12/2008, 03/12/2009  . Influenza, Seasonal, Injecte, Preservative Fre 03/13/2012  . Influenza,inj,Quad PF,6+ Mos 01/31/2014, 03/06/2015, 03/05/2016, 04/12/2017  . Influenza,inj,quad, With Preservative 03/23/2013  . Tdap 05/07/2011   Patient Active Problem List   Diagnosis Date Noted  . 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 Chronic hepatitis C virus infection ([place]) 12/10/2009    Priority: High  . Acute hemorrhoid 03/08/2014  . 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 Rheumatoid arthritis(714.0) 03/05/2011   Past Medical History:  Diagnosis Date  . 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 Humphrey Rolls  . Hemorrhoid    . Hepatitis B infection    Prior but patient not chronically infected  . Hepatitis C antibody test positive    Chronic Hep C  . 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 Rheumatoid arthritis [facility])    Past Surgical History:  Procedure Laterality Date  . HERNIA REPAIR    . TOTAL HIP ARTHROPLASTY Right 2014   [facility]   Social History   Socioeconomic History  . Marital status: Married    Spouse name: Not on file  . Number of children: Not on file  . Years of education: Not on file  . Highest education level: Not on file  Social Needs  . Financial resource strain: Not on file  . Food insecurity - worry: Not on file  . Food insecurity - inability: Not on file  . Transportation needs - medical: Not on file  . Transportation needs - non-medical: Not on file  Occupational History  . Not on file  Tobacco Use  . Smoking status: Former Smoker    Packs/day: 2.00    Years: 20.00    Pack years: 40.00    Types: Cigarettes    Last attempt to quit: 07/07/2006    Years since quitting: 10.7  . Smokeless tobacco: Never Used  Substance and Sexual Activity  . Alcohol use: Yes    Alcohol/week: 0.0 oz    Comment: occ  . Drug use: No  . Sexual activity: Not on file  Other Topics Concern  . Not on file  Social History Narrative   Avid basketball player, golfer, softball player.   History of remote IV drug use, clean for  many years now.   History reviewed. No pertinent family history. No Known Allergies  Medication list has been reviewed and updated.   General: Denies fever, chills, sweats. No significant weight loss. Eyes: Denies blurring,significant itching ENT: Denies earache, sore throat, and hoarseness. Cardiovascular: Denies chest pains, palpitations, dyspnea on exertion Respiratory: Denies cough, dyspnea at rest,wheeezing Breast: no concerns about lumps GI: Denies nausea, vomiting, diarrhea, constipation, change in bowel habits, abdominal pain, melena,  hematochezia GU: Denies penile discharge, ED, urinary flow / outflow problems. No STD concerns. Musculoskeletal: Denies back pain, joint pain Derm: Denies rash, itching Neuro: Denies  paresthesias, frequent falls, frequent headaches Psych: Denies depression, anxiety Endocrine: Denies cold intolerance, heat intolerance, polydipsia Heme: Denies enlarged lymph nodes Allergy: No hayfever  Objective:   BP 104/68   Pulse 68   Temp 98.2 F (36.8 C) (Oral)   Ht 5' 5.5" (1.664 m)   Wt 157 lb 8 oz (71.4 kg)   BMI 25.81 kg/m  Ideal Body Weight: Weight in (lb) to have BMI = 25: 152.2  No exam data present  GEN: well developed, well nourished, no acute distress Eyes: conjunctiva and lids normal, PERRLA, EOMI ENT: TM clear, nares clear, oral exam WNL Neck: supple, no lymphadenopathy, no thyromegaly, no JVD Pulm: clear to auscultation and percussion, respiratory effort normal CV: regular rate and rhythm, S1-S2, no murmur, rub or gallop, no bruits, peripheral pulses normal and symmetric, no cyanosis, clubbing, edema or varicosities GI: soft, non-tender; no hepatosplenomegaly, masses; active bowel sounds all quadrants GU: no hernia, testicular mass, penile discharge Lymph: no cervical, axillary or inguinal adenopathy MSK: gait normal, muscle tone and strength WNL, no joint swelling, effusions, discoloration, crepitus  SKIN: clear, good turgor, color WNL, no rashes, lesions, or ulcerations Neuro: normal mental status, normal strength, sensation, and motion Psych: alert; oriented to person, place and time, normally interactive and not anxious or depressed in appearance.  All labs reviewed with patient.  Lipids:    Component Value Date/Time   CHOL 242 (H) 12/03/2015 1535   TRIG 81.0 12/03/2015 1535   HDL 66.60 12/03/2015 1535   VLDL 16.2 12/03/2015 1535   CHOLHDL 4 12/03/2015 1535   CBC: CBC Latest Ref Rng & Units 12/03/2015 11/29/2014 03/08/2014  WBC 4.0 - 10.5 K/uL 17.8(H) 16.1(H)  13.1(H)  Hemoglobin 13.0 - 17.0 g/dL 15.5 15.4 15.2  Hematocrit 39.0 - 52.0 % 46.8 46.8 46.6  Platelets 150.0 - 400.0 K/uL 309.0 303.0 038.8    Basic Metabolic Panel:    Component Value Date/Time   NA 143 12/03/2015 1535   NA 144 07/06/2010 1236   K 4.3 12/03/2015 1535   K 4.1 07/06/2010 1236   CL 105 12/03/2015 1535   CL 101 07/06/2010 1236   CO2 31 12/03/2015 1535   CO2 32 07/06/2010 1236   BUN 13 12/03/2015 1535   BUN 16 07/06/2010 1236   CREATININE 1.17 12/03/2015 1535   CREATININE 1.06 02/05/2011 1458   GLUCOSE 92 12/03/2015 1535   GLUCOSE 110 07/06/2010 1236   CALCIUM 10.1 12/03/2015 1535   CALCIUM 9.0 07/06/2010 1236   Hepatic Function Latest Ref Rng & Units 12/03/2015 11/29/2014 02/22/2011  Total Protein 6.0 - 8.3 g/dL 7.0 6.2 7.1  Albumin 3.5 - 5.2 g/dL 4.7 4.2 3.8  AST 0 - 37 U/L 33 22 26  ALT 0 - 53 U/L 22 20 19   Alk Phosphatase 39 - 117 U/L 61 88 80  Total Bilirubin 0.2 - 1.2 mg/dL 0.7 0.5 0.2(L)  Bilirubin,  Direct 0.0 - 0.3 mg/dL 0.1 0.1 -    No results found for: TSH Lab Results  Component Value Date   PSA 0.58 12/03/2015   PSA 0.61 11/29/2014   PSA 0.60 10/01/2009    Assessment and Plan:   Healthcare maintenance - Plan: Basic metabolic panel, CBC with Differential/Platelet, Hepatic function panel, PSA, Lipid panel  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  Health Maintenance Exam: The patient's preventative maintenance and recommended screening tests for an annual wellness exam were reviewed in full today. Brought up to date unless services declined.  Counselled on the importance of diet, exercise, and its role in overall health and mortality. The patient's FH and SH was reviewed, including their home life, tobacco status, and  drug and alcohol status.  Follow-up in 1 year for physical exam or additional follow-up below.  Follow-up: Return in about 1 year (around 04/21/2018). Or follow-up in 1 year if not noted.  Medications Discontinued During This Encounter  Medication Reason  . oseltamivir (TAMIFLU) 75 MG capsule Completed Course  . hydrocortisone-pramoxine (ANALPRAM-HC) 2.5-1 % rectal cream Completed Course   Orders Placed This Encounter  Procedures  . Basic metabolic panel  . CBC with Differential/Platelet  . Hepatic function panel  . PSA  . Lipid panel  . Alkaline phosphatase  . Lactate Dehydrogenase    Signed,  Vinia Jemmott T. Marilou Barnfield, MD   Allergies as of 04/21/2017   No Known Allergies     Medication List        Accurate as of 04/21/17  1:54 PM. Always use your most recent med list.          sildenafil 20 MG tablet Commonly known as:  REVATIO TAKE 1 TO 5 TABLETS BY MOUTH 30 MINUTES PRIOR TO INTERCOURSE   zolpidem 10 MG tablet Commonly known as:  AMBIEN TAKE 1 TABLET BY MOUTH ONCE A DAY AT BEDTIME AS NEEDED

## 2017-04-22 LAB — LACTATE DEHYDROGENASE: LDH: 222 U/L (ref 120–250)

## 2017-04-30 NOTE — Progress Notes (Signed)
This encounter was created in error - please disregard.

## 2017-06-14 HISTORY — PX: COLONOSCOPY: SHX174

## 2017-06-17 ENCOUNTER — Other Ambulatory Visit: Payer: Self-pay | Admitting: Family Medicine

## 2017-07-01 ENCOUNTER — Other Ambulatory Visit: Payer: Self-pay | Admitting: *Deleted

## 2017-07-01 MED ORDER — HYDROCORTISONE ACE-PRAMOXINE 2.5-1 % RE CREA: 1.0000 "application " | TOPICAL_CREAM | Freq: Three times a day (TID) | RECTAL | 1 refills | Status: DC

## 2017-07-01 NOTE — Telephone Encounter (Signed)
Last office visit 04/21/2017.  Not on current medication list.  Ok to refill?

## 2017-07-04 MED ORDER — HYDROCORTISONE 2.5 % RE CREA: 1.0000 "application " | TOPICAL_CREAM | Freq: Two times a day (BID) | RECTAL | 0 refills | Status: DC

## 2017-07-04 NOTE — Telephone Encounter (Signed)
Received fax from [facility] requesting we change Rx to Proctozone HC 2.5% due to patient has a high deductible and it would be less expensive.  Rx sent as requested.

## 2017-07-04 NOTE — Addendum Note (Signed)
Addended by: Carter Kitten on: 07/04/2017 12:56 PM   Modules accepted: Orders

## 2017-08-10 ENCOUNTER — Ambulatory Visit: Payer: Self-pay | Admitting: *Deleted

## 2017-08-10 MED ORDER — OSELTAMIVIR PHOSPHATE 75 MG PO CAPS: 75.0000 mg | ORAL_CAPSULE | Freq: Two times a day (BID) | ORAL | 0 refills | Status: DC

## 2017-08-10 NOTE — Telephone Encounter (Signed)
Philip Tran notified as instructed by telephone.Marland Kitchen  He denies any CP or SOB.  Tamiflu sent into [facility].  Advised he should not return to work until 24 hours after fever resolves.

## 2017-08-10 NOTE — Telephone Encounter (Signed)
Pharmacy on file Reason for Disposition . Patient is HIGH RISK (e.g., age > 14 years, pregnant, HIV+, or chronic medical condition)  Answer Assessment - Initial Assessment Questions Thinks he has the flu. Headaches, nasal and chest congestion, diarrhea, low grade temp, productive cough. He has CLL and wants to jump on this.    1. WORST SYMPTOM: "What is your worst symptom?" (e.g., cough, runny nose, muscle aches, headache, sore throat, fever)      Body aches 2. ONSET: "When did your flu symptoms start?"     Monday-48 hours ago 3. COUGH: "How bad is the cough?"      Coughing up green phlegm 4. RESPIRATORY DISTRESS: "Describe your breathing."     normal 5. FEVER: "Do you have a fever?" If so, ask: "What is your temperature, how was it measured, and when did it start?"     99.6 oral today 6. EXPOSURE: "Were you exposed to someone with influenza?"   Doesn't know but works with the public in [employer]. 7. FLU VACCINE: "Did you get a flu shot this year?"     yes 8. HIGH RISK DISEASE: "Do you any chronic medical problems?" (e.g., heart or lung disease, asthma, weak immune system, or other HIGH RISK conditions)   Chronic L Leukemia 9. PREGNANCY: "Is there any chance you are pregnant?" "When was your last menstrual period?" na 10. OTHER SYMPTOMS: "Do you have any other symptoms?"  (e.g., runny nose, muscle aches, headache, sore throat)       Diarrhea since Tuesday, 2-3 times a day. Headache with dizzy feeling when walking around a lot.  Protocols used: INFLUENZA - SEASONAL-A-AH Wife has the shingles at this time.

## 2017-08-10 NOTE — Telephone Encounter (Signed)
Given high risk and flu like symptoms.. As long as no chest pain, no shortness of breath then will prescribe tamiflu 75 mg BID x 5 days #10 0 RF. If any SOB, CP.Marland Kitchen Needs to be seen stat. If severe sob.. Go to ER.

## 2017-08-10 NOTE — Addendum Note (Signed)
Addended by: Carter Kitten on: 08/10/2017 02:55 PM   Modules accepted: Orders

## 2017-08-11 ENCOUNTER — Telehealth: Payer: Self-pay | Admitting: *Deleted

## 2017-08-11 NOTE — Telephone Encounter (Signed)
Work note written and given to wife as requested.

## 2017-08-11 NOTE — Telephone Encounter (Signed)
Copied from [place] [phone]. Topic: General - Other >> Aug 11, 2017  3:55 PM Synthia Innocent wrote: Reason for CRM: Need letter to return to work on 08/15/17. See nurse triage note from yesterday. Please advise. Wife has appt today, can she pick up them?

## 2017-08-22 ENCOUNTER — Other Ambulatory Visit: Payer: Self-pay | Admitting: Family Medicine

## 2017-08-22 NOTE — Telephone Encounter (Signed)
Last office visit 04/21/2017.  Last refilled 02/23/2017 for #30 with  5 refills.  Ok to refill?

## 2017-09-01 ENCOUNTER — Other Ambulatory Visit: Payer: Self-pay | Admitting: Family Medicine

## 2017-09-01 NOTE — Telephone Encounter (Signed)
Last office visit 04/21/2017.  Last refilled 07/04/2017 for 30 g with no refills.  Ok to refill?

## 2017-09-02 ENCOUNTER — Ambulatory Visit: Payer: Self-pay | Admitting: *Deleted

## 2017-09-02 MED ORDER — PREDNISONE 20 MG PO TABS: ORAL_TABLET | ORAL | 0 refills | Status: DC

## 2017-09-02 NOTE — Telephone Encounter (Signed)
Left VM letting wife know Rx sent and advise her of Dr. Lillie Fragmin instructions and Rx sent to pharmacy

## 2017-09-02 NOTE — Addendum Note (Signed)
Addended by: Tammi Sou on: 09/02/2017 02:32 PM   Modules accepted: Orders

## 2017-09-02 NOTE — Telephone Encounter (Signed)
He does have RA, and I think that is pretty reasonable.   If he continues with difficulties, see me next week.  Prednisone 20 mg, 2 tabs po for 5 days, then 1 tab po for 5 days, #15, 0 ref

## 2017-09-02 NOTE — Telephone Encounter (Signed)
Wife Olin Hauser called in for husband (he is at work) requesting Prednisone for his Rheumatoid arthritis.  He is having stiffness and swelling in both of his hands.   He has had RA in his feet before and was given Prednisone which helped.  Wife stated,  "He will not come in for an appt".   "His schedule is crazy and I can tell you on a Friday he can't get in".   "Will Dr. Lorelei Pont call in the prednisone"?   I let her know that he may want to see him first but I would route a note to him with her request.  Someone from the office will be in touch.    It's ok to call his wife and can leave a message.  (She's on the [facility]).   (She has a dental appt this morning so leaving a message is ok).     I have routed a note to Dr. Lillie Fragmin nurse pool making them aware this request.   Reason for Disposition . Swollen joint of new onset  Answer Assessment - Initial Assessment Questions 1. ONSET: "When did the pain start?"     Wife Olin Hauser had called in requesting Prednisone be called in for [place] because he has Rheumatoid arthritis.   Both of his hands are swollen and stiff.   He is refusing to come in for an appt.   Per wife Dr. Lorelei Pont is very familiar with his health problems of Hepatitis and leukemia.   Her husband is very picky about what medications he will take due to these 3 illnesses. 2. LOCATION: "Where is the pain located?"     Both hands swollen and stiff.   He has had this in his feet before and was given Prednisone taper dose pack which gave me relief. 3. PAIN: "How bad is the pain?" (Scale 1-10; or mild, moderate, severe)   - MILD (1-3): doesn't interfere with normal activities   - MODERATE (4-7): interferes with normal activities (e.g., work or school) or awakens from sleep   - SEVERE (8-10): excruciating pain, unable to use hand at all     Wife not with husband.   Husband is at work. 4. WORK OR EXERCISE: "Has there been any recent work or exercise that involved this part of the body?"     Not asked.   Wife not with husband at the time of the call. 5. CAUSE: "What do you think is causing the pain?"     Wife mentioned it is Rheumatoid arthritis in his hands.  He has had it in his feet before and the Prednisone helped. 6. AGGRAVATING FACTORS: "What makes the pain worse?" (e.g., using computer)     7. OTHER SYMPTOMS: "Do you have any other symptoms?" (e.g., neck pain, swelling, rash, numbness, fever)     *No Answer* 8. PREGNANCY: "Is there any chance you are pregnant?" "When was your last menstrual period?"     *No Answer*  Protocols used: HAND AND WRIST PAIN-A-AH

## 2017-09-03 ENCOUNTER — Telehealth: Payer: Self-pay | Admitting: Surgical

## 2017-09-03 NOTE — Telephone Encounter (Signed)
[facility] nurse called in because patient was supposed to have prednisone at the pharmacy from yesterday. Medication was not there. Per Dr. Juleen China I phoned in medications to the pharmacy.

## 2017-09-05 MED ORDER — PREDNISONE 20 MG PO TABS: ORAL_TABLET | ORAL | 0 refills | Status: DC

## 2017-09-05 NOTE — Telephone Encounter (Signed)
Rx resent.

## 2017-09-05 NOTE — Addendum Note (Signed)
Addended by: Tammi Sou on: 09/05/2017 08:31 AM   Modules accepted: Orders

## 2017-09-05 NOTE — Telephone Encounter (Signed)
PLEASE NOTE: All timestamps contained within this report are represented as [place] Standard Time. CONFIDENTIALTY NOTICE: This fax transmission is intended only for the addressee. It contains information that is legally privileged, confidential or otherwise protected from use or disclosure. If you are not the intended recipient, you are strictly prohibited from reviewing, disclosing, copying using or disseminating any of this information or taking any action in reliance on or regarding this information. If you have received this fax in error, please notify us immediately by telephone so that we can arrange for its return to [place]. Phone: [phone], Toll-Free: [phone], Fax: [phone] Page: 1 of 2 Call Id: [id number] [place] Patient Name: Philip Tran Gender: Unknown DOB: 04-26-1956 Age: 62 Y 14 M 9 D Return Phone Number: [phone] (Primary), [phone] (Secondary) [place] [address] Client [place] Night - Client Client Site [place] Physician Copland, Frederico Hamman - MD Contact Type Call Who Is Calling Patient / Member / Family / Caregiver Call Type Triage / Clinical Caller Name Colbert Curenton Relationship To Patient Spouse Return Phone Number [phone] (Primary) Chief Complaint Hand Pain Reason for Call Symptomatic / Request for Health Information Initial Comment Caller states an rx for steroid was to be sent in yesterday. Is at the pharmacy but it is not there. It is for rheumatoid arthritis. Pharmacy closes at 1. She is upset that it was not done yesterday. Translation No Nurse Assessment Nurse: Allene Dillon, RN, [place] Date/Time (Eastern Time): 09/03/2017 11:01:43 AM Confirm and document reason for call. If symptomatic, describe symptoms. ---Caller states an rx for steroid was to be sent in  yesterday. Is at the pharmacy but it is not there. It is for rheumatoid arthritis. Pharmacy closes at 1. She is upset that it was not done yesterday. Does the patient have any new or worsening symptoms? ---No Please document clinical information provided and list any resource used. ---caller only requesting medication Nurse: Allene Dillon, RN, [place] Date/Time Eilene Ghazi Time): 09/03/2017 11:02:23 AM Please select the assessment type ---RX called in but not at pharm Additional Documentation ---Caller states an rx for steroid was to be sent in yesterday. Is at the pharmacy but it is not there. It is for rheumatoid arthritis. Pharmacy closes at 1. She is upset that it was not done yesterday. Document the name of the medication. ---prednisone Pharmacy name and phone number. ---[facility] [phone] Has the office closed within the last 30 minutes? ---No Does the client directives allow for assistance with medications after hours? ---Yes Is there an on-call physician for the client? ---Yes Guidelines Guideline Title Affirmed Question Affirmed Notes Nurse Date/Time Eilene Ghazi Time) PLEASE NOTE: All timestamps contained within this report are represented as [place] Standard Time. CONFIDENTIALTY NOTICE: This fax transmission is intended only for the addressee. It contains information that is legally privileged, confidential or otherwise protected from use or disclosure. If you are not the intended recipient, you are strictly prohibited from reviewing, disclosing, copying using or disseminating any of this information or taking any action in reliance on or regarding this information. If you have received this fax in error, please notify us immediately by telephone so that we can arrange for its return to [place]. Phone: [phone], Toll-Free: [phone], Fax: [phone] Page: 2 of 2 Call Id: [id number] [place]. Time Eilene Ghazi Time) Disposition Final User 09/03/2017 10:56:05 AM Send To Nurse Ria Comment, RN, April 09/03/2017 11:03:55 AM  Pharmacy Call Allene Dillon, RN, [place] Reason: spoke with pharmacist and verified rx not at the pharmacy 09/03/2017 11:06:20 AM [facility] Provider [place], RN, [place] 09/03/2017 11:08:39 AM Clinical Call Yes Allene Dillon, RN, [place] Phone DateTime Result/Outcome Message Type Notes Briscoe Deutscher- DO [phone] 09/03/2017 11:06:20 AM Called On Call Provider - Reached Doctor Paged Briscoe Deutscher- DO 09/03/2017 11:06:33 AM Spoke with On Call - General Message Result on call to call in the medication

## 2017-10-04 ENCOUNTER — Encounter: Payer: Self-pay | Admitting: Family Medicine

## 2017-10-04 ENCOUNTER — Other Ambulatory Visit: Payer: Self-pay | Admitting: Family Medicine

## 2017-10-04 DIAGNOSIS — K648 Other hemorrhoids: Secondary | ICD-10-CM

## 2017-10-04 DIAGNOSIS — K625 Hemorrhage of anus and rectum: Secondary | ICD-10-CM

## 2017-10-05 ENCOUNTER — Ambulatory Visit (INDEPENDENT_AMBULATORY_CARE_PROVIDER_SITE_OTHER): Payer: PRIVATE HEALTH INSURANCE | Admitting: Internal Medicine

## 2017-10-05 ENCOUNTER — Encounter: Payer: Self-pay | Admitting: Internal Medicine

## 2017-10-05 ENCOUNTER — Telehealth: Payer: Self-pay | Admitting: Gastroenterology

## 2017-10-05 VITALS — BP 150/90 | HR 80 | Temp 97.7°F | Ht 65.5 in | Wt 164.0 lb

## 2017-10-05 DIAGNOSIS — K644 Residual hemorrhoidal skin tags: Secondary | ICD-10-CM

## 2017-10-05 NOTE — Telephone Encounter (Signed)
Please see Dr. Doyne Keel note, can you schedule patient please.

## 2017-10-05 NOTE — Telephone Encounter (Signed)
Note reviewed. The concern is for bleeding hemorrhoids, sounds like they bled a fair amount. If there are any APPs that have slots either later today or this week you can book him and they can staff it with someone who does hemorrhoidal banding. I am unfortunately out of the office the next 2 days. If he can wait until Monday April 29th I'm happy to add him on to my schedule at 415 for an exam and possible banding. If he's never had a colonoscopy he will warrant that as well.   That being said, if you speak with him and if he has significant bleeding from this that persists and can't wait, however, he will need to go to the hospital for management. Thanks

## 2017-10-05 NOTE — Progress Notes (Signed)
Subjective:    Patient ID: Philip Tran, male    DOB: 03-24-1956, 62 y.o.   MRN: [id number]  HPI Here with wife for bad rectal bleeding Past hemorrhoid problems Started passing some blood and having pain for a while (weeks) Tried proctozone cream---some help  Really started bleeding a lot this morning Blood in shorts today Did move bowels today---just a little blood Not much on toilet paper Then bleeding got worse  Current Outpatient Medications on File Prior to Visit  Medication Sig Dispense Refill  . PROCTOZONE-HC 2.5 % rectal cream PLACE 1 APPLICATION RECTALLY 2 TIMES DAAILY. 30 g 1  . sildenafil (REVATIO) 20 MG tablet TAKE 1 TO 5 TABLETS BY MOUTH 30 MINUTES PRIOR TO INTERCOURSE 30 tablet 11  . zolpidem (AMBIEN) 10 MG tablet TAKE 1 TABLET BY MOUTH ONCE A DAY AT BEDTIME AS NEEDED 30 tablet 5   No current facility-administered medications on file prior to visit.     Allergies  Allergen Reactions  . Codeine Nausea Only    Past Medical History:  Diagnosis Date  . 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 Humphrey Rolls  . Hemorrhoid   . Hepatitis B infection    Prior but patient not chronically infected  . Hepatitis C antibody test positive    Chronic Hep C  . 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 Rheumatoid arthritis [facility])     Past Surgical History:  Procedure Laterality Date  . HERNIA REPAIR    . TOTAL HIP ARTHROPLASTY Right 2014   [facility]    History reviewed. No pertinent family history.  Social History   Socioeconomic History  . Marital status: Married    Spouse name: Not on file  . Number of children: Not on file  . Years of education: Not on file  . Highest education level: Not on file  Occupational History  . Not on file  Social Needs  . Financial resource strain: Not on file  . Food insecurity:    Worry: Not on file    Inability: Not on file  . Transportation needs:    Medical: Not on file    Non-medical: Not on  file  Tobacco Use  . Smoking status: Former Smoker    Packs/day: 2.00    Years: 20.00    Pack years: 40.00    Types: Cigarettes    Last attempt to quit: 07/07/2006    Years since quitting: 11.2  . Smokeless tobacco: Never Used  Substance and Sexual Activity  . Alcohol use: Yes    Alcohol/week: 0.0 oz    Comment: occ  . Drug use: No  . Sexual activity: Not on file  Lifestyle  . Physical activity:    Days per week: Not on file    Minutes per session: Not on file  . Stress: Not on file  Relationships  . Social connections:    Talks on phone: Not on file    Gets together: Not on file    Attends religious service: Not on file    Active member of club or organization: Not on file    Attends meetings of clubs or organizations: Not on file    Relationship status: Not on file  . Intimate partner violence:    Fear of current or ex partner: Not on file    Emotionally abused: Not on file    Physically abused: Not on file    Forced sexual activity: Not on file  Other  Topics Concern  . Not on file  Social History Narrative   Avid basketball player, golfer, softball player.   History of remote IV drug use, clean for many years now.   Review of Systems Not dizzy No pain in stomach Appetite is okay No ASA or NSAID use regularly Not constipated    Objective:   Physical Exam  Constitutional: He appears well-developed. No distress.  Genitourinary:  Genitourinary Comments: Circumferential non thrombosed hemorrhoids Some oozing--especially after the rectal exam Only mild tenderness No apparent internal lesions on limited rectal exam          Assessment & Plan:

## 2017-10-05 NOTE — Telephone Encounter (Signed)
Please see PCP note from today, bleeding hemorrhoids.

## 2017-10-05 NOTE — Assessment & Plan Note (Signed)
Just oozing now No evidence of thrombosis or other more concerning lesion This has been chronic and now worse Discussed using compress to stop bleeding Continue the cream prn Will get urgent GI evaluation (to consider banding and never had colonoscopy)

## 2017-10-05 NOTE — Telephone Encounter (Signed)
Pt is scheduled to see Philip Tran on 10-06-17.  Pt is aware of appt

## 2017-10-06 ENCOUNTER — Ambulatory Visit (INDEPENDENT_AMBULATORY_CARE_PROVIDER_SITE_OTHER): Payer: PRIVATE HEALTH INSURANCE | Admitting: Physician Assistant

## 2017-10-06 ENCOUNTER — Encounter: Payer: Self-pay | Admitting: Physician Assistant

## 2017-10-06 ENCOUNTER — Other Ambulatory Visit (INDEPENDENT_AMBULATORY_CARE_PROVIDER_SITE_OTHER): Payer: PRIVATE HEALTH INSURANCE

## 2017-10-06 VITALS — BP 126/80 | HR 71 | Ht 66.5 in | Wt 166.1 lb

## 2017-10-06 DIAGNOSIS — K625 Hemorrhage of anus and rectum: Secondary | ICD-10-CM | POA: Diagnosis not present

## 2017-10-06 DIAGNOSIS — K644 Residual hemorrhoidal skin tags: Secondary | ICD-10-CM

## 2017-10-06 LAB — CBC WITH DIFFERENTIAL/PLATELET
BASOS PCT: 0.4 % (ref 0.0–3.0)
Basophils Absolute: 0.1 10*3/uL (ref 0.0–0.1)
EOS ABS: 0.7 10*3/uL (ref 0.0–0.7)
Eosinophils Relative: 5.6 % — ABNORMAL HIGH (ref 0.0–5.0)
HCT: 43.7 % (ref 39.0–52.0)
HEMOGLOBIN: 14.8 g/dL (ref 13.0–17.0)
Lymphocytes Relative: 64.4 % — ABNORMAL HIGH (ref 12.0–46.0)
Lymphs Abs: 8.5 10*3/uL — ABNORMAL HIGH (ref 0.7–4.0)
MCHC: 33.7 g/dL (ref 30.0–36.0)
MCV: 84.7 fl (ref 78.0–100.0)
MONO ABS: 0.5 10*3/uL (ref 0.1–1.0)
Monocytes Relative: 3.6 % (ref 3.0–12.0)
NEUTROS PCT: 26 % — AB (ref 43.0–77.0)
Neutro Abs: 3.5 10*3/uL (ref 1.4–7.7)
PLATELETS: 296 10*3/uL (ref 150.0–400.0)
RBC: 5.16 Mil/uL (ref 4.22–5.81)
RDW: 15.2 % (ref 11.5–15.5)
WBC: 13.3 10*3/uL — AB (ref 4.0–10.5)

## 2017-10-06 MED ORDER — NA SULFATE-K SULFATE-MG SULF 17.5-3.13-1.6 GM/177ML PO SOLN: 1.0000 | Freq: Once | ORAL | 0 refills | Status: AC

## 2017-10-06 NOTE — Patient Instructions (Addendum)
Your provider has requested that you go to the basement level for lab work before leaving today. Press "B" on the elevator. The lab is located at the first door on the left as you exit the elevator.  You have been scheduled for a colonoscopy. Please follow written instructions given to you at your visit today.  Please pick up your prep supplies at the pharmacy within the next 1-3 days.[employer]. If you use inhalers (even only as needed), please bring them with you on the day of your procedure. Your physician has requested that you go to www.startemmi.com and enter the access code given to you at your visit today. This web site gives a general overview about your procedure. However, you should still follow specific instructions given to you by our office regarding your preparation for the procedure.  Continue the Proctozone rectal cream 2.5 % Twice daily.  If you are age 88 or younger, your body mass index should be between 19-25. Your Body mass index is 26.41 kg/m. If this is out of the aformentioned range listed, please consider follow up with your Primary Care Provider.

## 2017-10-06 NOTE — Progress Notes (Signed)
Reviewed and agree with documentation and assessment and plan. K. Veena Krimson Massmann , MD   

## 2017-10-06 NOTE — Progress Notes (Signed)
Subjective:    Patient ID: Philip Tran, male    DOB: 1955/09/21, 62 y.o.   MRN: [id number]  HPI Philip Tran is a pleasant 62 year old white male, new to GI today referred by Dr. Silvio Pate for rectal bleeding.  Patient has not had any prior GI evaluation. He does have history of rheumatoid arthritis, hepatitis C she had previously been treated and eradicated, he is status post right hip arthroplasty and also has diagnosis of CLL.  He is followed by [facility], and has never required treatment. States that he has been having small amounts of intermittent rectal bleeding over the past couple of months, always bright red blood and usually separate from a bowel movement , noticing bright red  blood in the commode after a bowel movement.  He denies any rectal pain.  He does feel that he has external hemorrhoids which have been large and present for many years. Yesterday he had a bowel movement followed by a small amount of bright red blood, but then started oozing blood from his rectum, and this apparently lasted several hours.  Is having to use paper towels etc. it is not but does not remember seeing any clots.  He was evaluated by Dr. Silvio Pate and noted some small amount of oozing and set him up for appointment today. Patient says the bleeding seemed to stop last night he had a bowel movement today which was normal and did not notice any blood.  He has been using Proctosol twice daily over the past few days. He says his bowel habits have been still little over the past year or so with looser stools, generally once daily.Marland Kitchen  He has no complaints of abdominal pain.  Appetite is been fine and weight has been stable. Family history is negative for colon cancer polyps and inflammatory bowel disease.  Review of Systems Pertinent positive and negative review of systems were noted in the above HPI section.  All other review of systems was otherwise negative.  Outpatient Encounter Medications as of 10/06/2017  Medication Sig    . PROCTOZONE-HC 2.5 % rectal cream PLACE 1 APPLICATION RECTALLY 2 TIMES DAAILY.  . sildenafil (REVATIO) 20 MG tablet TAKE 1 TO 5 TABLETS BY MOUTH 30 MINUTES PRIOR TO INTERCOURSE  . zolpidem (AMBIEN) 10 MG tablet TAKE 1 TABLET BY MOUTH ONCE A DAY AT BEDTIME AS NEEDED  . Na Sulfate-K Sulfate-Mg Sulf 17.5-3.13-1.6 GM/177ML SOLN Take 1 kit by mouth once for 1 dose.   No facility-administered encounter medications on file as of 10/06/2017.    Allergies  Allergen Reactions  . Codeine Nausea Only   Patient Active Problem List   Diagnosis Date Noted  . Bleeding external hemorrhoids 10/05/2017  . Acute hemorrhoid 03/08/2014  . 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 Rheumatoid arthritis(714.0) 03/05/2011  . Chronic lymphoblastic leukemia ([place]) 03/03/2011  . Chronic hepatitis C virus infection ([place]) 12/10/2009   Social History   Socioeconomic History  . Marital status: Married    Spouse name: Not on file  . Number of children: Not on file  . Years of education: Not on file  . Highest education level: Not on file  Occupational History  . Not on file  Social Needs  . Financial resource strain: Not on file  . Food insecurity:    Worry: Not on file    Inability: Not on file  . Transportation needs:    Medical: Not on file  Non-medical: Not on file  Tobacco Use  . Smoking status: Former Smoker    Packs/day: 2.00    Years: 20.00    Pack years: 40.00    Types: Cigarettes    Last attempt to quit: 07/07/2006    Years since quitting: 11.2  . Smokeless tobacco: Never Used  Substance and Sexual Activity  . Alcohol use: Yes    Alcohol/week: 0.0 oz    Comment: 3 beers a week  . Drug use: No  . Sexual activity: Not on file  Lifestyle  . Physical activity:    Days per week: Not on file    Minutes per session: Not on file  . Stress: Not on file  Relationships  . Social connections:    Talks on phone: Not on file     Gets together: Not on file    Attends religious service: Not on file    Active member of club or organization: Not on file    Attends meetings of clubs or organizations: Not on file    Relationship status: Not on file  . Intimate partner violence:    Fear of current or ex partner: Not on file    Emotionally abused: Not on file    Physically abused: Not on file    Forced sexual activity: Not on file  Other Topics Concern  . Not on file  Social History Narrative   Avid basketball player, golfer, softball player.   History of remote IV drug use, clean for many years now.    Mr. Winski family history is not on file.      Objective:    Vitals:   10/06/17 0848  BP: 126/80  Pulse: 71    Physical Exam; well-developed white male in no acute distress, accompanied by his wife both pleasant blood pressure 126/80 pulse 71, height 5 foot 6, weight 166, BMI 26.4.  HEENT; nontraumatic normocephalic EOMI PERRLA sclera anicteric, Cardiovascular ;regular rate and rhythm with S1-S2 no murmur rub or gallop, Pulmonary clear bilaterally, Abdomen ;soft, nontender nondistended bowel sounds are active there is no palpable mass or hepatosplenomegaly, Rectal; exam not repeated today this was done per Dr. Silvio Pate yesterday with finding of circumferential nonthrombosed external hemorrhoids, per his note no apparent internal lesions on limited rectal exam there was some oozing of blood.  Extremities ;no clubbing cyanosis or edema skin warm and dry, Neuro psych ;mood and affect appropriate       Assessment & Plan:   #69 62 year old white male with intermittent mild volume hematochezia over the past couple of months, followed by a larger amount of bleeding that occurred yesterday with oozing of bright red blood after a bowel movement. Etiology of bleeding is not clear, this may be secondary to internal hemorrhoids, will need to rule out occult colon lesion/neoplasm, or proctitis/colitis  #2 history of CLL #3  status post right hip arthroplasty #4 history of hep C treated and eradicated #5 rheumatoid arthritis  Plan; CBC today Continue Proctosol 2.5% twice daily, refill sent Patient will be scheduled for colonoscopy with Dr. Silverio Decamp.  Procedure was discussed in detail with the patient including indications risks and benefits and he is agreeable to proceed. Further plans pending results of above.  We did briefly discuss in office hemorrhoidal banding as an option to treat the internal hemorrhoids and decrease episodes of bleeding, should this be found to be the etiology.  Samiah Ricklefs S Emunah Texidor PA-C 10/06/2017   Cc: Venia Carbon, MD

## 2017-10-14 ENCOUNTER — Encounter: Payer: PRIVATE HEALTH INSURANCE | Admitting: Gastroenterology

## 2017-11-05 ENCOUNTER — Other Ambulatory Visit: Payer: Self-pay | Admitting: Family Medicine

## 2017-11-05 NOTE — Telephone Encounter (Signed)
Last office visit 4/224/19 with Dr. Silvio Pate.  Seen by GI on 10/06/17.  Last refilled 09/01/17 fir 30 g with 1 refills.  Ok to refill?

## 2017-11-11 ENCOUNTER — Other Ambulatory Visit: Payer: Self-pay

## 2017-11-11 ENCOUNTER — Ambulatory Visit (AMBULATORY_SURGERY_CENTER): Payer: PRIVATE HEALTH INSURANCE | Admitting: Gastroenterology

## 2017-11-11 ENCOUNTER — Encounter: Payer: Self-pay | Admitting: Gastroenterology

## 2017-11-11 ENCOUNTER — Encounter: Payer: PRIVATE HEALTH INSURANCE | Admitting: Gastroenterology

## 2017-11-11 VITALS — BP 119/66 | HR 66 | Temp 98.0°F | Resp 22 | Ht 66.5 in | Wt 166.0 lb

## 2017-11-11 DIAGNOSIS — D128 Benign neoplasm of rectum: Secondary | ICD-10-CM

## 2017-11-11 DIAGNOSIS — D127 Benign neoplasm of rectosigmoid junction: Secondary | ICD-10-CM

## 2017-11-11 DIAGNOSIS — K921 Melena: Secondary | ICD-10-CM

## 2017-11-11 DIAGNOSIS — K625 Hemorrhage of anus and rectum: Secondary | ICD-10-CM

## 2017-11-11 MED ORDER — SODIUM CHLORIDE 0.9 % IV SOLN: 500.0000 mL | Freq: Once | INTRAVENOUS | Status: DC

## 2017-11-11 NOTE — Patient Instructions (Signed)
HANDOUTS GIVEN FOR POLYPS, DIVERTICULOSIS AND HEMORRHOIDS  YOU HAD AN ENDOSCOPIC PROCEDURE TODAY AT THE [facility]:   Refer to the procedure report that was given to you for any specific questions about what was found during the examination.  If the procedure report does not answer your questions, please call your gastroenterologist to clarify.  If you requested that your care partner not be given the details of your procedure findings, then the procedure report has been included in a sealed envelope for you to review at your convenience later.  YOU SHOULD EXPECT: Some feelings of bloating in the abdomen. Passage of more gas than usual.  Walking can help get rid of the air that was put into your GI tract during the procedure and reduce the bloating. If you had a lower endoscopy (such as a colonoscopy or flexible sigmoidoscopy) you may notice spotting of blood in your stool or on the toilet paper. If you underwent a bowel prep for your procedure, you may not have a normal bowel movement for a few days.  Please Note:  You might notice some irritation and congestion in your nose or some drainage.  This is from the oxygen used during your procedure.  There is no need for concern and it should clear up in a day or so.  SYMPTOMS TO REPORT IMMEDIATELY:   Following lower endoscopy (colonoscopy or flexible sigmoidoscopy):  Excessive amounts of blood in the stool  Significant tenderness or worsening of abdominal pains  Swelling of the abdomen that is new, acute  Fever of 100F or higher  For urgent or emergent issues, a gastroenterologist can be reached at any hour by calling [phone].   DIET:  We do recommend a small meal at first, but then you may proceed to your regular diet.  Drink plenty of fluids but you should avoid alcoholic beverages for 24 hours.  ACTIVITY:  You should plan to take it easy for the rest of today and you should NOT DRIVE or use heavy machinery until tomorrow  (because of the sedation medicines used during the test).    FOLLOW UP: Our staff will call the number listed on your records the next business day following your procedure to check on you and address any questions or concerns that you may have regarding the information given to you following your procedure. If we do not reach you, we will leave a message.  However, if you are feeling well and you are not experiencing any problems, there is no need to return our call.  We will assume that you have returned to your regular daily activities without incident.  If any biopsies were taken you will be contacted by phone or by letter within the next 1-3 weeks.  Please call us at [phone] if you have not heard about the biopsies in 3 weeks.    SIGNATURES/CONFIDENTIALITY: You and/or your care partner have signed paperwork which will be entered into your electronic medical record.  These signatures attest to the fact that that the information above on your After Visit Summary has been reviewed and is understood.  Full responsibility of the confidentiality of this discharge information lies with you and/or your care-partner. 

## 2017-11-11 NOTE — Progress Notes (Signed)
Report to PACU, RN, vss, BBS= Clear.  

## 2017-11-11 NOTE — Op Note (Signed)
[name] Procedure Date: 11/11/2017 9:29 AM MRN: [id number] Endoscopist: Mauri Pole , MD Age: 62 Referring MD:  Date of Birth: September 08, 1955 Gender: Male Account #: [id number] Procedure:                Colonoscopy Indications:              Evaluation of unexplained GI bleeding Medicines:                Monitored Anesthesia Care Procedure:                Pre-Anesthesia Assessment:                           - Prior to the procedure, a History and Physical                            was performed, and patient medications and                            allergies were reviewed. The patient's tolerance of                            previous anesthesia was also reviewed. The risks                            and benefits of the procedure and the sedation                            options and risks were discussed with the patient.                            All questions were answered, and informed consent                            was obtained. Prior Anticoagulants: The patient has                            taken no previous anticoagulant or antiplatelet                            agents. ASA Grade Assessment: II - A patient with                            mild systemic disease. After reviewing the risks                            and benefits, the patient was deemed in                            satisfactory condition to undergo the procedure.                           After obtaining informed consent, the colonoscope  was passed under direct vision. Throughout the                            procedure, the patient's blood pressure, pulse, and                            oxygen saturations were monitored continuously. The                            Colonoscope was introduced through the anus and                            advanced to the the cecum, identified by                            appendiceal orifice and  ileocecal valve. The                            colonoscopy was performed without difficulty. The                            patient tolerated the procedure well. The quality                            of the bowel preparation was excellent. The                            ileocecal valve, appendiceal orifice, and rectum                            were photographed. Ileocecal valve photograph not                            saved in error. Scope In: 9:45:47 AM Scope Out: 10:05:40 AM Scope Withdrawal Time: 0 hours 13 minutes 47 seconds  Total Procedure Duration: 0 hours 19 minutes 53 seconds  Findings:                 The perianal and digital rectal examinations were                            normal.                           A 12 mm polyp was found in the recto-sigmoid colon.                            The polyp was semi-pedunculated. The polyp was                            removed with a hot snare. Resection and retrieval                            were complete.  A 3 mm polyp was found in the rectum. The polyp was                            sessile. The polyp was removed with a cold biopsy                            forceps. Resection and retrieval were complete.                           Non-bleeding internal hemorrhoids were found during                            retroflexion. The hemorrhoids were medium-sized.                           Multiple small and large-mouthed diverticula were                            found in the sigmoid colon and descending colon. Complications:            No immediate complications. Estimated Blood Loss:     Estimated blood loss was minimal. Impression:               - One 12 mm polyp at the recto-sigmoid colon,                            removed with a hot snare. Resected and retrieved.                           - One 3 mm polyp in the rectum, removed with a cold                            biopsy forceps. Resected and  retrieved.                           - Non-bleeding internal hemorrhoids.                           - Moderate diverticulosis in the sigmoid colon and                            in the descending colon. Recommendation:           - Patient has a contact number available for                            emergencies. The signs and symptoms of potential                            delayed complications were discussed with the                            patient. Return to normal activities tomorrow.  Written discharge instructions were provided to the                            patient.                           - Resume previous diet.                           - Continue present medications.                           - Await pathology results.                           - Repeat colonoscopy in 3 - 5 years for                            surveillance based on pathology results. Mauri Pole, MD 11/11/2017 10:09:40 AM This report has been signed electronically.

## 2017-11-11 NOTE — Progress Notes (Signed)
Called to room to assist during endoscopic procedure.  Patient ID and intended procedure confirmed with present staff. Received instructions for my participation in the procedure from the performing physician.  

## 2017-11-14 ENCOUNTER — Telehealth: Payer: Self-pay

## 2017-11-14 ENCOUNTER — Encounter: Payer: Self-pay | Admitting: Family Medicine

## 2017-11-14 MED ORDER — CEFDINIR 300 MG PO CAPS: 600.0000 mg | ORAL_CAPSULE | Freq: Every day | ORAL | 0 refills | Status: DC

## 2017-11-14 NOTE — Telephone Encounter (Signed)
Left message

## 2017-11-14 NOTE — Telephone Encounter (Signed)
  Follow up Call-  Call back number 11/11/2017  Post procedure Call Back phone  # [phone]  Permission to leave phone message Yes  Some recent data might be hidden     Left message

## 2017-11-14 NOTE — Telephone Encounter (Signed)
  Cefdinir 300 mg, 2 tabs po daily for 10 days, #20  F/u if no improvement in 5-7 days or if worsens

## 2017-11-16 ENCOUNTER — Encounter: Payer: Self-pay | Admitting: Gastroenterology

## 2018-02-10 ENCOUNTER — Other Ambulatory Visit: Payer: Self-pay | Admitting: Family Medicine

## 2018-02-10 NOTE — Telephone Encounter (Signed)
Last office visit 10/05/2017 with Dr. Silvio Pate for hemorrhoids.   Last refilled 11/07/2017 for 30 g with 1 refill.  Ok to refill?

## 2018-02-21 ENCOUNTER — Other Ambulatory Visit: Payer: Self-pay | Admitting: *Deleted

## 2018-02-21 NOTE — Telephone Encounter (Signed)
Last office visit 10/05/17 with Dr. Silvio Pate for hemorrhoids. No future appointments.  Last refilled 08/22/2017 for #30 with 5 refills.

## 2018-02-22 MED ORDER — ZOLPIDEM TARTRATE 10 MG PO TABS
ORAL_TABLET | ORAL | 5 refills | Status: DC
Start: 2018-02-22 — End: 2018-08-10

## 2018-03-02 ENCOUNTER — Ambulatory Visit (INDEPENDENT_AMBULATORY_CARE_PROVIDER_SITE_OTHER): Payer: PRIVATE HEALTH INSURANCE

## 2018-03-02 DIAGNOSIS — Z23 Encounter for immunization: Secondary | ICD-10-CM

## 2018-03-06 ENCOUNTER — Ambulatory Visit: Payer: PRIVATE HEALTH INSURANCE | Admitting: Family Medicine

## 2018-05-16 ENCOUNTER — Encounter: Payer: Self-pay | Admitting: Family Medicine

## 2018-05-17 MED ORDER — PREDNISONE 20 MG PO TABS: ORAL_TABLET | ORAL | 0 refills | Status: DC

## 2018-05-31 ENCOUNTER — Encounter: Payer: Self-pay | Admitting: Family Medicine

## 2018-05-31 ENCOUNTER — Ambulatory Visit (INDEPENDENT_AMBULATORY_CARE_PROVIDER_SITE_OTHER): Payer: PRIVATE HEALTH INSURANCE | Admitting: Family Medicine

## 2018-05-31 ENCOUNTER — Encounter: Payer: Self-pay | Admitting: *Deleted

## 2018-05-31 VITALS — BP 138/78 | HR 74 | Temp 98.2°F | Ht 65.5 in | Wt 166.5 lb

## 2018-05-31 DIAGNOSIS — R197 Diarrhea, unspecified: Secondary | ICD-10-CM | POA: Diagnosis not present

## 2018-05-31 DIAGNOSIS — J069 Acute upper respiratory infection, unspecified: Secondary | ICD-10-CM

## 2018-05-31 NOTE — Patient Instructions (Signed)
Good to see you today   I think you have a viral illness  For nasal congestion, add Afrin type nasal spray twice a day for up to 4 days Continue sudafed and ibuprofen, once in morning and one in early afternoon as needed Can also use saline nasal spray 3-5 times a day to rinse sinuses  For diarrhea, eat bland diet and drink enough liquids to make your urine light yellow. Can take 1/2 tablet immodium (gneric is fine) twice a day as needed.   If not better in 3-5 days or if worsening diarrhea, please follow up

## 2018-05-31 NOTE — Progress Notes (Signed)
Subjective:    Patient ID: Philip Tran, male    DOB: 19-Sep-1955, 62 y.o.   MRN: [id number]  HPI This is a 62 yo male who presents today with head congestion x 7 days and diarrhea x 3 days.  Nasal drainage is blood tinged. Some post nasal drainage. Headache. Has been taking 1 sudafed in the early part of day and ibuprofen 600 mg once a day with some temporary relief. Sore throat, no cough, some bilateral ear pain/pressure. Chills and sweats, no measured fever. Diarrhea 4-5 times a day. Stool is watery/loose. No blood or mucus. No abdominal pain. Positive sick contacts.  Has history of CLL.   Past Medical History:  Diagnosis Date  . 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 Humphrey Rolls  . Hemorrhoid   . Hepatitis B infection    Prior but patient not chronically infected  . Hepatitis C antibody test positive    Chronic Hep C  . 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 Rheumatoid arthritis [facility])    Past Surgical History:  Procedure Laterality Date  . HERNIA REPAIR    . TOTAL HIP ARTHROPLASTY Right 2014   [facility]   No family history on file. Social History   Tobacco Use  . Smoking status: Former Smoker    Packs/day: 2.00    Years: 20.00    Pack years: 40.00    Types: Cigarettes    Last attempt to quit: 07/07/2006    Years since quitting: 11.9  . Smokeless tobacco: Never Used  Substance Use Topics  . Alcohol use: Yes    Alcohol/week: 0.0 standard drinks    Comment: 3 beers a week  . Drug use: No      Review of Systems Per HPI    Objective:   Physical Exam Vitals signs reviewed.  Constitutional:      General: He is not in acute distress.    Appearance: Normal appearance. He is normal weight. He is not ill-appearing or toxic-appearing.  HENT:     Head: Normocephalic and atraumatic.     Right Ear: Tympanic membrane, ear canal and external ear normal.     Left Ear: Tympanic membrane, ear canal and external ear normal.     Nose: Congestion  present. No rhinorrhea.     Right Sinus: No maxillary sinus tenderness or frontal sinus tenderness.     Left Sinus: No maxillary sinus tenderness or frontal sinus tenderness.     Mouth/Throat:     Mouth: Mucous membranes are moist.     Pharynx: No oropharyngeal exudate or posterior oropharyngeal erythema.  Eyes:     Conjunctiva/sclera: Conjunctivae normal.  Neck:     Musculoskeletal: Normal range of motion and neck supple.  Cardiovascular:     Rate and Rhythm: Normal rate and regular rhythm.     Heart sounds: Normal heart sounds.  Pulmonary:     Effort: Pulmonary effort is normal.     Breath sounds: Normal breath sounds.  Lymphadenopathy:     Cervical: No cervical adenopathy.  Skin:    General: Skin is warm and dry.     Capillary Refill: Capillary refill takes less than 2 seconds.     Comments: Normal tugor.   Neurological:     Mental Status: He is alert and oriented to person, place, and time.  Psychiatric:        Mood and Affect: Mood normal.        Behavior: Behavior normal.  Thought Content: Thought content normal.        Judgment: Judgment normal.       BP 138/78   Pulse 74   Temp 98.2 F (36.8 C) (Oral)   Ht 5' 5.5" (1.664 m)   Wt 166 lb 8 oz (75.5 kg)   SpO2 96%   BMI 27.29 kg/m  Wt Readings from Last 3 Encounters:  05/31/18 166 lb 8 oz (75.5 kg)  11/11/17 166 lb (75.3 kg)  10/06/17 166 lb 2 oz (75.4 kg)       Assessment & Plan:  1. Viral URI - Provided written and verbal information regarding diagnosis and treatment. -  Patient Instructions  Good to see you today   I think you have a viral illness  For nasal congestion, add Afrin type nasal spray twice a day for up to 4 days Continue sudafed and ibuprofen, once in morning and one in early afternoon as needed Can also use saline nasal spray 3-5 times a day to rinse sinuses  For diarrhea, eat bland diet and drink enough liquids to make your urine light yellow. Can take 1/2 tablet immodium  (gneric is fine) twice a day as needed.   If not better in 3-5 days or if worsening diarrhea, please follow up  2. Diarrhea, unspecified type - encouraged bland foods, adequate fluid intake - RTC precautions reviewed   Clarene Reamer, FNP-BC  [facility]  05/31/2018 7:24 PM

## 2018-07-26 ENCOUNTER — Encounter: Payer: PRIVATE HEALTH INSURANCE | Admitting: Family Medicine

## 2018-07-26 ENCOUNTER — Encounter: Payer: Self-pay | Admitting: Gastroenterology

## 2018-07-26 ENCOUNTER — Ambulatory Visit (INDEPENDENT_AMBULATORY_CARE_PROVIDER_SITE_OTHER): Payer: PRIVATE HEALTH INSURANCE | Admitting: Gastroenterology

## 2018-07-26 VITALS — BP 128/82 | HR 76 | Ht 67.0 in | Wt 162.5 lb

## 2018-07-26 DIAGNOSIS — K625 Hemorrhage of anus and rectum: Secondary | ICD-10-CM

## 2018-07-26 DIAGNOSIS — K641 Second degree hemorrhoids: Secondary | ICD-10-CM

## 2018-07-26 NOTE — Patient Instructions (Signed)

## 2018-07-26 NOTE — Progress Notes (Signed)
Philip Tran    [id number]    1955/10/26  Primary Care Physician:Copland, Frederico Hamman, MD  Referring Physician: Owens Loffler, MD Philip Tran,  [id number]  Chief complaint:  Hemorrhoids, Philip Tran,   HPI: 63 year old male with history of chronic lympho blastic leukemia and internal hemorrhoids here with complaints of intermittent bright red blood per rectum from symptomatic hemorrhoids requesting hemorrhoidal band ligation. Otherwise doing well.  Colonoscopy 11/11/2017 Tubular adenoma removed Diverticulosis Internal hemorrhoids   Outpatient Encounter Medications as of 07/26/2018  Medication Sig  . hydrocortisone (ANUSOL-HC) 2.5 % rectal cream PLACE 1 APPLICATION RECTALLY 2 TIMES DAAILY. (Patient taking differently: Place 1 application rectally as needed. )  . sildenafil (REVATIO) 20 MG tablet TAKE 1 TO 5 TABLETS BY MOUTH 30 MINUTES PRIOR TO INTERCOURSE  . zolpidem (AMBIEN) 10 MG tablet TAKE 1 TABLET BY MOUTH ONCE A DAY AT BEDTIME AS NEEDED  . [DISCONTINUED] 0.9 %  sodium chloride infusion    No facility-administered encounter medications on file as of 07/26/2018.     Allergies as of 07/26/2018 - Review Complete 07/26/2018  Allergen Reaction Noted  . Codeine Nausea Only 09/16/2015    Past Medical History:  Diagnosis Date  . 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 Hemorrhoid   . Hepatitis B infection    Prior but patient not chronically infected  . Hepatitis C antibody test positive    Chronic Hep C  . 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 Rheumatoid arthritis [facility])     Past Surgical History:  Procedure Laterality Date  . HERNIA REPAIR    . TOTAL HIP ARTHROPLASTY Right 2014   [facility]    History reviewed. No pertinent family history.  Social History   Socioeconomic History  . Marital status: Married    Spouse name: Not on file  . Number of children: Not on file  . Years of education:  Not on file  . Highest education level: Not on file  Occupational History  . Not on file  Social Needs  . Financial resource strain: Not on file  . Food insecurity:    Worry: Not on file    Inability: Not on file  . Transportation needs:    Medical: Not on file    Non-medical: Not on file  Tobacco Use  . Smoking status: Former Smoker    Packs/day: 2.00    Years: 20.00    Pack years: 40.00    Types: Cigarettes    Last attempt to quit: 07/07/2006    Years since quitting: 12.0  . Smokeless tobacco: Never Used  Substance and Sexual Activity  . Alcohol use: Yes    Alcohol/week: 0.0 standard drinks    Comment: 3 beers a week  . Drug use: No  . Sexual activity: Not on file  Lifestyle  . Physical activity:    Days per week: Not on file    Minutes per session: Not on file  . Stress: Not on file  Relationships  . Social connections:    Talks on phone: Not on file    Gets together: Not on file    Attends religious service: Not on file    Active member of club or organization: Not on file    Attends meetings of clubs or organizations: Not on file    Relationship status: Not on file  . Intimate partner violence:    Fear  of current or ex partner: Not on file    Emotionally abused: Not on file    Physically abused: Not on file    Forced sexual activity: Not on file  Other Topics Concern  . Not on file  Social History Narrative   Avid basketball player, golfer, softball player.   History of remote IV drug use, clean for many years now.      Review of systems: Review of Systems  Constitutional: Negative for fever and chills.  HENT: Negative.   Eyes: Negative for blurred vision.  Respiratory: Negative for cough, shortness of breath and wheezing.   Cardiovascular: Negative for chest pain and palpitations.  Gastrointestinal: as per HPI Genitourinary: Negative for dysuria, urgency, frequency and hematuria.  Musculoskeletal: Negative for myalgias, back pain and joint pain.    Skin: Negative for itching and rash.  Neurological: Negative for dizziness, tremors, focal weakness, seizures and loss of consciousness.  Endo/Heme/Allergies: Positive for seasonal allergies.  Psychiatric/Behavioral: Negative for depression, suicidal ideas and hallucinations.  All other systems reviewed and are negative.   Physical Exam: Vitals:   07/26/18 1408  BP: 128/82  Pulse: 76   Body mass index is 25.45 kg/m. Gen:      No acute distress HEENT:  EOMI, sclera anicteric Neck:     No masses; no thyromegaly Lungs:    Clear to auscultation bilaterally; normal respiratory effort CV:         Regular rate and rhythm; no murmurs Abd:      + bowel sounds; soft, non-tender; no palpable masses, no distension Ext:    No edema; adequate peripheral perfusion Skin:      Warm and dry; no rash Neuro: alert and oriented x 3 Psych: normal mood and affect  Data Reviewed:  Reviewed labs, radiology imaging, old records and pertinent past GI work up   Assessment and Plan/Recommendations:  PROCEDURE NOTE: The patient presents with symptomatic grade 2-3 hemorrhoids, requesting rubber band ligation of his/her hemorrhoidal disease.  All risks, benefits and alternative forms of therapy were described and informed consent was obtained.  In the Left Lateral Decubitus position anoscopic examination revealed grade 2-3 hemorrhoids in the Right posterior, left lateral and right anterior position(s).  The anorectum was pre-medicated with 0.125% nitroglycerin and RectiCare The decision was made to band the Right posterior internal hemorrhoid, and the [place] was used to perform band ligation without complication.  Digital anorectal examination was then performed to assure proper positioning of the band, and to adjust the banded tissue as required.  The patient was discharged home without pain or other issues.  Dietary and behavioral recommendations were given and along with follow-up  instructions.     The following adjunctive treatments were recommended:  Benefiber 1 teaspoon 3 times daily with meals  The patient will return in 2 to 4 weeks for  follow-up and possible additional banding as required. No complications were encountered and the patient tolerated the procedure well.    Damaris Hippo , MD [phone]    CC: Philip Loffler, MD

## 2018-07-31 ENCOUNTER — Other Ambulatory Visit: Payer: Self-pay | Admitting: Family Medicine

## 2018-07-31 NOTE — Telephone Encounter (Signed)
Last office visit 05/31/2008 with D. Carlean Purl to Viral URI.  Seen by GI on 07/26/2018.  Last refilled 02/14/2018 for 30 g with 1 refill.  CPE scheduled for 09/04/2018.

## 2018-08-04 ENCOUNTER — Ambulatory Visit (INDEPENDENT_AMBULATORY_CARE_PROVIDER_SITE_OTHER): Payer: PRIVATE HEALTH INSURANCE | Admitting: Gastroenterology

## 2018-08-04 ENCOUNTER — Encounter: Payer: Self-pay | Admitting: Gastroenterology

## 2018-08-04 VITALS — BP 122/70 | HR 76 | Ht 65.75 in | Wt 165.1 lb

## 2018-08-04 DIAGNOSIS — K642 Third degree hemorrhoids: Secondary | ICD-10-CM | POA: Diagnosis not present

## 2018-08-04 NOTE — Patient Instructions (Signed)

## 2018-08-04 NOTE — Progress Notes (Signed)
PROCEDURE NOTE: The patient presents with symptomatic grade II-III hemorrhoids, requesting rubber band ligation of his/her hemorrhoidal disease.  All risks, benefits and alternative forms of therapy were described and informed consent was obtained.   The anorectum was pre-medicated with 0.125% nitroglycerin and RectiCare The decision was made to band the left lateral internal hemorrhoid, and the [place] was used to perform band ligation without complication.  Digital anorectal examination was then performed to assure proper positioning of the band, and to adjust the banded tissue as required.  The patient was discharged home without pain or other issues.  Dietary and behavioral recommendations were given and along with follow-up instructions.     The following adjunctive treatments were recommended: Benefiber 1 tablespoon 3 times daily with meals  The patient will return in 2 to 4 weeks for  follow-up and possible additional banding as required. No complications were encountered and the patient tolerated the procedure well.  Damaris Hippo , MD [phone]

## 2018-08-10 ENCOUNTER — Other Ambulatory Visit: Payer: Self-pay | Admitting: Family Medicine

## 2018-08-10 NOTE — Telephone Encounter (Signed)
Last office visit 05/31/2018 with Philip Tran for viral URI.  Last refilled 02/22/2018 for #30 with with 5 refills.  CPE scheduled for 09/04/2018.

## 2018-08-16 ENCOUNTER — Encounter: Payer: PRIVATE HEALTH INSURANCE | Admitting: Gastroenterology

## 2018-08-21 ENCOUNTER — Encounter: Payer: PRIVATE HEALTH INSURANCE | Admitting: Gastroenterology

## 2018-08-28 ENCOUNTER — Telehealth: Payer: Self-pay | Admitting: Family Medicine

## 2018-08-28 NOTE — Telephone Encounter (Signed)
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 Pt want to know what does he need to do to stay safe. Please call pt

## 2018-08-28 NOTE — Telephone Encounter (Signed)
Mr. Ironside notified as instructed by telephone.  He is asking for a letter to give to his employer.

## 2018-08-28 NOTE — Telephone Encounter (Signed)
Please call.  Difficult question.  He is at increased risk compared to the general population. Wash hands as frequently as possible with antibiotic soap for 20 seconds.  With his cancer and risk, the CDC and [place] health authorities are recommending "social distancing" - like staying at home and minimizing person to person interactions - particularly for someone with CLL.  He should speak to his employer.  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  If he needs anything from me, we can help / letter / FMLA.  There is not a good answer with this, and we are learning daily.

## 2018-08-29 NOTE — Telephone Encounter (Signed)
Pt is aware donna will call when letter is ready for pick up

## 2018-08-30 NOTE — Telephone Encounter (Signed)
Mr. Tarbet notified letter is ready to be picked up at the front desk.

## 2018-08-30 NOTE — Telephone Encounter (Signed)
done

## 2018-09-01 ENCOUNTER — Telehealth: Payer: Self-pay | Admitting: Family Medicine

## 2018-09-01 NOTE — Telephone Encounter (Signed)
Pt left v/m wanting to know if FMLA paperwork was ready for pick up? Pt request cb.

## 2018-09-01 NOTE — Telephone Encounter (Signed)
Standard for FMLA is 10-14 days.

## 2018-09-01 NOTE — Telephone Encounter (Signed)
Pt aware dr copland has been out of office and I will call to let him know when paperwork is ready for pick up

## 2018-09-01 NOTE — Telephone Encounter (Signed)
Pt dropped off fmla paperwork and short term disability claim forms  In dr copland in box

## 2018-09-04 ENCOUNTER — Encounter: Payer: PRIVATE HEALTH INSURANCE | Admitting: Family Medicine

## 2018-09-04 ENCOUNTER — Encounter: Payer: PRIVATE HEALTH INSURANCE | Admitting: Gastroenterology

## 2018-09-04 DIAGNOSIS — Z0279 Encounter for issue of other medical certificate: Secondary | ICD-10-CM

## 2018-09-04 NOTE — Telephone Encounter (Signed)
Pt left v/m wanting to ck on status of FMLA paperwork. Pt request cb.

## 2018-09-04 NOTE — Telephone Encounter (Signed)
Completed to the best of my ability 

## 2018-09-05 NOTE — Telephone Encounter (Signed)
Pt aware paperwork is ready for pick up

## 2018-09-05 NOTE — Telephone Encounter (Signed)
Pt aware paperwork is ready for pick up  He is aware he will have to fill his part out and turn in  Copy for scan Copy for pt Copy for billing

## 2018-09-14 ENCOUNTER — Telehealth: Payer: Self-pay

## 2018-09-14 NOTE — Telephone Encounter (Signed)
Outbound call to patient - patient has decided due to his current health status to not return to work at this time.

## 2018-09-14 NOTE — Telephone Encounter (Signed)
Inbound call to triage - patient requested a return to work note.

## 2018-09-25 ENCOUNTER — Encounter: Payer: Self-pay | Admitting: Family Medicine

## 2018-09-26 ENCOUNTER — Encounter: Payer: PRIVATE HEALTH INSURANCE | Admitting: Gastroenterology

## 2018-10-23 ENCOUNTER — Telehealth: Payer: Self-pay | Admitting: Family Medicine

## 2018-10-23 NOTE — Telephone Encounter (Signed)
Forms placed in Dr. Copland's in box to complete.  ?

## 2018-10-23 NOTE — Telephone Encounter (Signed)
Pt dropped off a Return to work form to be filled out. Form placed in RX tower for Dr. Lorelei Pont.

## 2018-10-24 NOTE — Telephone Encounter (Signed)
done

## 2018-10-24 NOTE — Telephone Encounter (Signed)
Tarique notified by telephone that his return to work forms are ready to be picked up at the front desk.

## 2018-10-25 ENCOUNTER — Encounter: Payer: Self-pay | Admitting: Family Medicine

## 2018-10-25 NOTE — Telephone Encounter (Signed)
Do you mind doing this?

## 2018-12-30 ENCOUNTER — Other Ambulatory Visit: Payer: Self-pay | Admitting: Family Medicine

## 2019-01-01 NOTE — Telephone Encounter (Signed)
Last office visit 05/31/2008 with D. Carlean Purl to Viral URI.  Seen by GI on 07/26/2018.  Last refilled 07/31/2018 for 30 g with 1 refill.  No future appointments.

## 2019-01-12 ENCOUNTER — Other Ambulatory Visit: Payer: Self-pay | Admitting: Family Medicine

## 2019-01-12 NOTE — Telephone Encounter (Signed)
Last office visit 05/31/2018 fir Viral URI.  Last refilled 08/10/2018 for #30 with 5 refills.  No future appointments.

## 2019-02-09 ENCOUNTER — Ambulatory Visit (INDEPENDENT_AMBULATORY_CARE_PROVIDER_SITE_OTHER): Payer: PRIVATE HEALTH INSURANCE | Admitting: Gastroenterology

## 2019-02-09 ENCOUNTER — Encounter: Payer: Self-pay | Admitting: Gastroenterology

## 2019-02-09 DIAGNOSIS — K641 Second degree hemorrhoids: Secondary | ICD-10-CM | POA: Diagnosis not present

## 2019-02-09 NOTE — Progress Notes (Signed)
PROCEDURE NOTE: The patient presents with symptomatic grade II internal  hemorrhoids, requesting rubber band ligation of his/her hemorrhoidal disease.  All risks, benefits and alternative forms of therapy were described and informed consent was obtained.   The anorectum was pre-medicated with 0.125% Nitroglycerine and Recticare The decision was made to band the Right anterior internal hemorrhoid, and the [place] was used to perform band ligation without complication.  Digital anorectal examination was then performed to assure proper positioning of the band, and to adjust the banded tissue as required.  The patient was discharged home without pain or other issues.  Dietary and behavioral recommendations were given and along with follow-up instructions.     The following adjunctive treatments were recommended: Benefiber 1 tablespoon BID with meals  The patient will return follow-up and possible additional banding as needed No complications were encountered and the patient tolerated the procedure well.

## 2019-02-09 NOTE — Patient Instructions (Signed)
Take Fiber Choice once daily  Follow up as needed  I appreciate the  opportunity to care for you  Thank You   Harl Bowie , MD   HEMORRHOID BANDING PROCEDURE    FOLLOW-UP CARE   1. The procedure you have had should have been relatively painless since the banding of the area involved does not have nerve endings and there is no pain sensation.  The rubber band cuts off the blood supply to the hemorrhoid and the band may fall off as soon as 48 hours after the banding (the band may occasionally be seen in the toilet bowl following a bowel movement). You may notice a temporary feeling of fullness in the rectum which should respond adequately to plain Tylenol or Motrin.  2. Following the banding, avoid strenuous exercise that evening and resume full activity the next day.  A sitz bath (soaking in a warm tub) or bidet is soothing, and can be useful for cleansing the area after bowel movements.     3. To avoid constipation, take two tablespoons of natural wheat bran, natural oat bran, flax, Benefiber or any over the counter fiber supplement and increase your water intake to 7-8 glasses daily.    4. Unless you have been prescribed anorectal medication, do not put anything inside your rectum for two weeks: No suppositories, enemas, fingers, etc.  5. Occasionally, you may have more bleeding than usual after the banding procedure.  This is often from the untreated hemorrhoids rather than the treated one.  Don't be concerned if there is a tablespoon or so of blood.  If there is more blood than this, lie flat with your bottom higher than your head and apply an ice pack to the area. If the bleeding does not stop within a half an hour or if you feel faint, call our office at [phone] or go to the emergency room.  6. Problems are not common; however, if there is a substantial amount of bleeding, severe pain, chills, fever or difficulty passing urine (very rare) or other problems, you should  call us at [phone] or report to the nearest emergency room.  7. Do not stay seated continuously for more than 2-3 hours for a day or two after the procedure.  Tighten your buttock muscles 10-15 times every two hours and take 10-15 deep breaths every 1-2 hours.  Do not spend more than a few minutes on the toilet if you cannot empty your bowel; instead re-visit the toilet at a later time.

## 2019-03-08 ENCOUNTER — Ambulatory Visit: Payer: PRIVATE HEALTH INSURANCE

## 2019-07-31 ENCOUNTER — Telehealth: Payer: Self-pay | Admitting: *Deleted

## 2019-07-31 NOTE — Telephone Encounter (Signed)
Last office visit 05/31/2018 for Viral URI.  Last refilled 01/12/2019 for #30 with 5 refills.  No future appointments.

## 2019-08-01 MED ORDER — ZOLPIDEM TARTRATE 10 MG PO TABS: ORAL_TABLET | ORAL | 0 refills | Status: DC

## 2019-08-01 NOTE — Telephone Encounter (Signed)
Left message asking pt to call office  °

## 2019-08-01 NOTE — Telephone Encounter (Signed)
He should have a follow-up physical.  I have not seen him for some time.

## 2019-08-06 NOTE — Telephone Encounter (Signed)
Appointment 3/3

## 2019-08-15 ENCOUNTER — Encounter: Payer: Self-pay | Admitting: Family Medicine

## 2019-08-15 ENCOUNTER — Other Ambulatory Visit: Payer: Self-pay

## 2019-08-15 ENCOUNTER — Ambulatory Visit (INDEPENDENT_AMBULATORY_CARE_PROVIDER_SITE_OTHER): Payer: 59 | Admitting: Family Medicine

## 2019-08-15 VITALS — BP 130/80 | HR 77 | Temp 97.6°F | Ht 65.5 in | Wt 158.8 lb

## 2019-08-15 DIAGNOSIS — Z0000 Encounter for general adult medical examination without abnormal findings: Secondary | ICD-10-CM | POA: Diagnosis not present

## 2019-08-15 NOTE — Progress Notes (Signed)
Philip Devall T. Ionia Schey, MD Primary Care and [place] at [facility] [place], [address] Phone: [phone]  FAX: [phone]  Philip Tran - 64 y.o. male  MRN [id number]  Date of Birth: 1956/02/16  Visit Date: 08/15/2019  PCP: Owens Loffler, MD  Referred by: Owens Loffler, MD  Chief Complaint  Patient presents with  . Annual Exam    This visit occurred during the SARS-CoV-2 public health emergency.  Safety protocols were in place, including screening questions prior to the visit, additional usage of staff PPE, and extensive cleaning of exam room while observing appropriate contact time as indicated for disinfecting solutions.   Patient Care Team: Owens Loffler, MD as PCP - General Subjective:   Philip Tran is a 64 y.o. pleasant patient who presents with the following:  Preventative Health Maintenance Visit:  Health Maintenance Summary Reviewed and updated, unless pt declines services.  Tobacco History Reviewed. 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   Blood work - couple of hours ago. (ate) LDL only  6-7 hours a night  Health Maintenance  Topic Date Due  . COLONOSCOPY  11/11/2020  . TETANUS/TDAP  05/06/2021  . INFLUENZA VACCINE  Completed  . Hepatitis C Screening  Completed  . HIV Screening  Completed   Immunization History  Administered Date(s) Administered  . Hepatitis A, Adult 09/03/2010, 08/24/2011  . Influenza Split 03/03/2011  . Influenza Whole 04/12/2008, 03/12/2009  . Influenza, Seasonal, Injecte, Preservative Fre 03/13/2012  . Influenza,inj,Quad PF,6+ Mos 01/31/2014, 03/06/2015, 03/05/2016, 04/12/2017, 03/02/2018, 03/06/2019  . Influenza,inj,quad, With  Preservative 03/23/2013  . Tdap 05/07/2011   Patient Active Problem List   Diagnosis Date Noted  . Chronic lymphoblastic leukemia 03/03/2011    Priority: High  . Chronic hepatitis C virus infection ([place]) 12/10/2009    Priority: High  . Bleeding external hemorrhoids 10/05/2017  . Acute hemorrhoid 03/08/2014  . 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 Rheumatoid arthritis(714.0) 03/05/2011    Past Medical History:  Diagnosis Date  . 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 Humphrey Rolls  . Hemorrhoid   . Hepatitis B infection    Prior but patient not chronically infected  . Hepatitis C antibody test positive    Chronic Hep C  . 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 Rheumatoid arthritis [facility])     Past Surgical History:  Procedure Laterality Date  . HERNIA REPAIR    . TOTAL HIP ARTHROPLASTY Right 2014   [facility]    History reviewed. No pertinent family history.  Past Medical History, Surgical History, Social History, Family History, Problem List, Medications, and Allergies have been reviewed and updated if relevant.  Review of Systems: Pertinent positives are listed above.  Otherwise, a full 14 point review of systems has been done in full and it is negative except where it is noted positive.  Objective:   BP 130/80   Pulse 77   Temp 97.6 F (36.4 C) (Temporal)   Ht 5' 5.5" (1.664 m)   Wt 158 lb 12 oz (72 kg)   SpO2 92%   BMI 26.02 kg/m  Ideal Body Weight: Weight in (lb)  to have BMI = 25: 152.2  Ideal Body Weight: Weight in (lb) to have BMI = 25: 152.2 No exam data present Depression screen [facility] 2/9 08/15/2019 04/21/2017  Decreased Interest 0 0  Down, Depressed, Hopeless 0 0  PHQ - 2 Score 0 0     GEN: well developed, well nourished, no acute distress Eyes: conjunctiva and lids normal, PERRLA, EOMI ENT: TM clear, nares clear, oral exam WNL Neck: supple, no lymphadenopathy, no  thyromegaly, no JVD Pulm: clear to auscultation and percussion, respiratory effort normal CV: regular rate and rhythm, S1-S2, no murmur, rub or gallop, no bruits, peripheral pulses normal and symmetric, no cyanosis, clubbing, edema or varicosities GI: soft, non-tender; no hepatosplenomegaly, masses; active bowel sounds all quadrants GU: no hernia, testicular mass, penile discharge Lymph: no cervical, axillary or inguinal adenopathy MSK: gait normal, muscle tone and strength WNL, no joint swelling, effusions, discoloration, crepitus  SKIN: clear, good turgor, color WNL, no rashes, lesions, or ulcerations Neuro: normal mental status, normal strength, sensation, and motion Psych: alert; oriented to person, place and time, normally interactive and not anxious or depressed in appearance.  All labs reviewed with patient. Results for orders placed or performed in visit on 10/06/17  CBC with Differential/Platelet  Result Value Ref Range   WBC 13.3 (H) 4.0 - 10.5 K/uL   RBC 5.16 4.22 - 5.81 Mil/uL   Hemoglobin 14.8 13.0 - 17.0 g/dL   HCT 43.7 39.0 - 52.0 %   MCV 84.7 78.0 - 100.0 fl   MCHC 33.7 30.0 - 36.0 g/dL   RDW 15.2 11.5 - 15.5 %   Platelets 296.0 150.0 - 400.0 K/uL   Neutrophils Relative % 26.0 (L) 43.0 - 77.0 %   Lymphocytes Relative 64.4 (H) 12.0 - 46.0 %   Monocytes Relative 3.6 3.0 - 12.0 %   Eosinophils Relative 5.6 (H) 0.0 - 5.0 %   Basophils Relative 0.4 0.0 - 3.0 %   Neutro Abs 3.5 1.4 - 7.7 K/uL   Lymphs Abs 8.5 (H) 0.7 - 4.0 K/uL   Monocytes Absolute 0.5 0.1 - 1.0 K/uL   Eosinophils Absolute 0.7 0.0 - 0.7 K/uL   Basophils Absolute 0.1 0.0 - 0.1 K/uL    Assessment and Plan:     ICD-10-CM   1. Healthcare maintenance  Z00.00 CBC with Differential/Platelet    Basic metabolic panel    Hepatic function panel    PSA, Total with Reflex to PSA, Free    Lipid panel   Globally doing well, but working a lot  [employer] vaccine  Health Maintenance Exam: The patient's  preventative maintenance and recommended screening tests for an annual wellness exam were reviewed in full today. Brought up to date unless services declined.  Counselled on the importance of diet, exercise, and its role in overall health and mortality. The patient's FH and SH was reviewed, including their home life, tobacco status, and drug and alcohol status.  Follow-up in 1 year for physical exam or additional follow-up below.  Follow-up: No follow-ups on file. Or follow-up in 1 year if not noted.  No orders of the defined types were placed in this encounter.  There are no discontinued medications. Orders Placed This Encounter  Procedures  . CBC with Differential/Platelet  . Basic metabolic panel  . Hepatic function panel  . PSA, Total with Reflex to PSA, Free  . Lipid panel    Signed,  Frederico Hamman T. Quinntin Malter, MD   Allergies as of 08/15/2019  Reactions   Codeine Nausea Only      Medication List       Accurate as of August 15, 2019  3:31 PM. If you have any questions, ask your nurse or doctor.        Proctozone-HC 2.5 % rectal cream Generic drug: hydrocortisone PLACE 1 APPLICATION RECTALLY TWICE DAILY   sildenafil 20 MG tablet Commonly known as: REVATIO TAKE 1 TO 5 TABLETS BY MOUTH 30 MINUTES PRIOR TO INTERCOURSE   zolpidem 10 MG tablet Commonly known as: AMBIEN TAKE 1 TABLET BY MOUTH EVERY NIGHT AT BEDTIME AS NEEDED

## 2019-08-15 NOTE — Patient Instructions (Signed)
OSTEOARTHRITIS:  Supplements: Tart cherry juice and Curcumin (Turmeric extract) have good scientific evidence  Glucosamine and Chondroitin often helpful - will take about 3 months to see if you have an effect. If you do, great, keep them up, if none at that point, no need to take in the future.

## 2019-08-16 LAB — CBC WITH DIFFERENTIAL/PLATELET
Basophils Absolute: 0.2 10*3/uL — ABNORMAL HIGH (ref 0.0–0.1)
Basophils Relative: 1.4 % (ref 0.0–3.0)
Eosinophils Absolute: 0.3 10*3/uL (ref 0.0–0.7)
Eosinophils Relative: 1.9 % (ref 0.0–5.0)
HCT: 45.9 % (ref 39.0–52.0)
Hemoglobin: 15.2 g/dL (ref 13.0–17.0)
Lymphocytes Relative: 63.5 % — ABNORMAL HIGH (ref 12.0–46.0)
Lymphs Abs: 9.9 10*3/uL — ABNORMAL HIGH (ref 0.7–4.0)
MCHC: 33 g/dL (ref 30.0–36.0)
MCV: 86.6 fl (ref 78.0–100.0)
Monocytes Absolute: 0.7 10*3/uL (ref 0.1–1.0)
Monocytes Relative: 4.3 % (ref 3.0–12.0)
Neutro Abs: 4.5 10*3/uL (ref 1.4–7.7)
Neutrophils Relative %: 28.9 % — ABNORMAL LOW (ref 43.0–77.0)
Platelets: 280 10*3/uL (ref 150.0–400.0)
RBC: 5.3 Mil/uL (ref 4.22–5.81)
RDW: 15.1 % (ref 11.5–15.5)
WBC: 15.6 10*3/uL — ABNORMAL HIGH (ref 4.0–10.5)

## 2019-08-16 LAB — LIPID PANEL
Cholesterol: 247 mg/dL — ABNORMAL HIGH (ref 0–200)
HDL: 68.3 mg/dL (ref >39.00)
LDL Cholesterol: 152 mg/dL — ABNORMAL HIGH (ref 0–99)
NonHDL: 178.59
Total CHOL/HDL Ratio: 4
Triglycerides: 132 mg/dL (ref 0.0–149.0)
VLDL: 26.4 mg/dL (ref 0.0–40.0)

## 2019-08-16 LAB — BASIC METABOLIC PANEL
BUN: 16 mg/dL (ref 6–23)
CO2: 33 mEq/L — ABNORMAL HIGH (ref 19–32)
Calcium: 10.2 mg/dL (ref 8.4–10.5)
Chloride: 101 mEq/L (ref 96–112)
Creatinine, Ser: 1.06 mg/dL (ref 0.40–1.50)
GFR: 70.92 mL/min (ref >60.00)
Glucose, Bld: 83 mg/dL (ref 70–99)
Potassium: 4.9 mEq/L (ref 3.5–5.1)
Sodium: 140 mEq/L (ref 135–145)

## 2019-08-16 LAB — HEPATIC FUNCTION PANEL
ALT: 20 U/L (ref 0–53)
AST: 30 U/L (ref 0–37)
Albumin: 4.3 g/dL (ref 3.5–5.2)
Alkaline Phosphatase: 77 U/L (ref 39–117)
Bilirubin, Direct: 0.1 mg/dL (ref 0.0–0.3)
Total Bilirubin: 0.5 mg/dL (ref 0.2–1.2)
Total Protein: 6.5 g/dL (ref 6.0–8.3)

## 2019-08-16 LAB — PSA, TOTAL WITH REFLEX TO PSA, FREE: PSA, Total: 1.1 ng/mL (ref <=4.0)

## 2019-08-17 ENCOUNTER — Other Ambulatory Visit: Payer: Self-pay | Admitting: Family Medicine

## 2019-08-17 DIAGNOSIS — C9110 Chronic lymphocytic leukemia of B-cell type not having achieved remission: Secondary | ICD-10-CM

## 2019-08-17 NOTE — Progress Notes (Signed)
onc referral

## 2019-08-24 ENCOUNTER — Telehealth: Payer: Self-pay | Admitting: Family Medicine

## 2019-08-24 NOTE — Telephone Encounter (Signed)
My recommendation for the patient was for him to follow-up with oncology.  He declined this recommendation.  This was against my medical advice.  He understands that this could have implications for his own morbidity and potentially his life span.

## 2019-08-24 NOTE — Telephone Encounter (Signed)
Spoke with patient directly and he was scheduled at [facility] as he didn't want to travel to [facility] . Appointment was scheduled and then the patient just called them and said he didn't want this Referral to just cancel the appointment. Received call from [facility] cancer [facility] letting you know that patient canceled.[facility] canceled the Appt.

## 2019-08-27 ENCOUNTER — Other Ambulatory Visit: Payer: Self-pay | Admitting: Family Medicine

## 2019-08-27 NOTE — Telephone Encounter (Signed)
Last office visit 08/15/2019 for CPE.  Last refilled 08/01/2019 for #30 with no refills.  No future appointments.

## 2019-08-28 ENCOUNTER — Inpatient Hospital Stay: Payer: Self-pay | Admitting: Internal Medicine

## 2019-10-15 ENCOUNTER — Other Ambulatory Visit: Payer: Self-pay | Admitting: Family Medicine

## 2019-10-15 NOTE — Telephone Encounter (Signed)
Last office visit 08/15/2019 for CPE. Refill is for Anusol HC.  Proctozone is on current medication list. No future appointments.  Refill?

## 2020-01-14 ENCOUNTER — Other Ambulatory Visit: Payer: Self-pay | Admitting: Family Medicine

## 2020-01-14 NOTE — Telephone Encounter (Signed)
Last office visit 08/15/2019 for CPE.  Last refilled 08/27/2019 for #30 with 5 refills. No future appointments.

## 2020-01-26 ENCOUNTER — Encounter: Payer: Self-pay | Admitting: Family Medicine

## 2020-02-05 NOTE — Telephone Encounter (Signed)
Philip Tran,   I wrote him a note and sent to you.  Can you help send to him? I think it is fine if you stamp my signature.  Thanks!

## 2020-04-23 ENCOUNTER — Encounter: Payer: Self-pay | Admitting: Family Medicine

## 2020-04-23 ENCOUNTER — Telehealth (INDEPENDENT_AMBULATORY_CARE_PROVIDER_SITE_OTHER): Payer: 59 | Admitting: Family Medicine

## 2020-04-23 VITALS — BP 127/82 | Temp 97.8°F | Ht 65.5 in

## 2020-04-23 DIAGNOSIS — J0110 Acute frontal sinusitis, unspecified: Secondary | ICD-10-CM | POA: Diagnosis not present

## 2020-04-23 MED ORDER — AMOXICILLIN 875 MG PO TABS: 875.0000 mg | ORAL_TABLET | Freq: Two times a day (BID) | ORAL | 0 refills | Status: AC

## 2020-04-23 MED ORDER — AMOXICILLIN-POT CLAVULANATE 875-125 MG PO TABS: 1.0000 | ORAL_TABLET | Freq: Two times a day (BID) | ORAL | 0 refills | Status: DC

## 2020-04-23 NOTE — Progress Notes (Signed)
     Philip Hudler T. Mariapaula Krist, MD Primary Care and [place] at [facility] [place], [address] Phone: [phone]  FAX: [phone]  [name] - 64 y.o. male  MRN [id number]  Date of Birth: [birth year]  Visit Date: 04/23/2020  PCP: Owens Loffler, MD  Referred by: Owens Loffler, MD Chief Complaint  Patient presents with  . Sinus Headaches    with blood/greenish mucus   Virtual Visit via Video Note:  I connected with  Philip Tran on 04/23/2020  2:00 PM EST by a video enabled telemedicine application and verified that I am speaking with the correct person using two identifiers.   Location patient: home computer, tablet, or smartphone Location provider: work or home office Consent: Verbal consent directly obtained from [employer] Persons participating in the virtual visit: patient, provider  I discussed the limitations of evaluation and management by telemedicine and the availability of in person appointments. The patient expressed understanding and agreed to proceed.  Interactive audio and video telecommunications were attempted between this provider and patient, however failed, due to patient having technical difficulties OR patient did not have access to video capability.  We continued and completed visit with audio only.    History of Present Illness:  Sinusitis 2-3 weeks, only sinuses  He presents with some worsening frontal headache pain.  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  Review of Systems as above: See pertinent positives and pertinent negatives per HPI No acute distress verbally   Observations/Objective/Exam:  An attempt was made to discern vital signs over the phone and per patient if applicable and  possible.   General:    Alert, Oriented, appears well and in no acute distress  Pulmonary:     On inspection no signs of respiratory distress.  Psych / Neurological:     Pleasant and cooperative.  Assessment and Plan:    ICD-10-CM   1. Acute non-recurrent frontal sinusitis  J01.10    Total encounter time: 10 minutes. This includes total time spent on the day of encounter.  Classic frontal sinusitis given timeframe.  Originally, I placed the patient on some Augmentin, but he called back later in the day was worried about potential diarrhea.  Converted to traditional amoxicillin 875 p.o. twice daily x10 days  I discussed the assessment and treatment plan with the patient. The patient was provided an opportunity to ask questions and all were answered. The patient agreed with the plan and demonstrated an understanding of the instructions.   The patient was advised to call back or seek an in-person evaluation if the symptoms worsen or if the condition fails to improve as anticipated.  Follow-up: prn unless noted otherwise below No follow-ups on file.  Meds ordered this encounter  Medications  . DISCONTD: amoxicillin-clavulanate (AUGMENTIN) 875-125 MG tablet    Sig: Take 1 tablet by mouth 2 (two) times daily for 10 days.    Dispense:  20 tablet    Refill:  0   No orders of the defined types were placed in this encounter.   Signed,  Maud Deed. Kamla Skilton, MD

## 2020-04-23 NOTE — Telephone Encounter (Signed)
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 Pt has already sent pt message requesting different abx.

## 2020-07-29 ENCOUNTER — Other Ambulatory Visit: Payer: Self-pay | Admitting: Family Medicine

## 2020-07-29 NOTE — Telephone Encounter (Signed)
Last office visit 04/23/2020 for sinusitis.  Last refilled 01/15/2020 for #30 with 5 refill.  No future appointments.

## 2020-09-04 ENCOUNTER — Other Ambulatory Visit: Payer: Self-pay

## 2020-09-04 ENCOUNTER — Ambulatory Visit (INDEPENDENT_AMBULATORY_CARE_PROVIDER_SITE_OTHER): Payer: 59 | Admitting: Family Medicine

## 2020-09-04 ENCOUNTER — Ambulatory Visit (INDEPENDENT_AMBULATORY_CARE_PROVIDER_SITE_OTHER)
Admission: RE | Admit: 2020-09-04 | Discharge: 2020-09-04 | Disposition: A | Discharge status: Home / Self Care | Payer: 59 | Source: Ambulatory Visit | Attending: Family Medicine | Admitting: Family Medicine

## 2020-09-04 ENCOUNTER — Encounter: Payer: Self-pay | Admitting: Family Medicine

## 2020-09-04 VITALS — BP 130/84 | HR 63 | Temp 98.0°F | Ht 65.5 in | Wt 158.0 lb

## 2020-09-04 DIAGNOSIS — M5412 Radiculopathy, cervical region: Secondary | ICD-10-CM | POA: Diagnosis not present

## 2020-09-04 DIAGNOSIS — C9110 Chronic lymphocytic leukemia of B-cell type not having achieved remission: Secondary | ICD-10-CM | POA: Diagnosis not present

## 2020-09-04 DIAGNOSIS — Z13220 Encounter for screening for lipoid disorders: Secondary | ICD-10-CM | POA: Diagnosis not present

## 2020-09-04 DIAGNOSIS — Z79899 Other long term (current) drug therapy: Secondary | ICD-10-CM

## 2020-09-04 DIAGNOSIS — Z131 Encounter for screening for diabetes mellitus: Secondary | ICD-10-CM

## 2020-09-04 DIAGNOSIS — Z125 Encounter for screening for malignant neoplasm of prostate: Secondary | ICD-10-CM

## 2020-09-04 LAB — BASIC METABOLIC PANEL
BUN: 18 mg/dL (ref 6–23)
CO2: 31 mEq/L (ref 19–32)
Calcium: 9 mg/dL (ref 8.4–10.5)
Chloride: 104 mEq/L (ref 96–112)
Creatinine, Ser: 0.99 mg/dL (ref 0.40–1.50)
GFR: 81.63 mL/min (ref >60.00)
Glucose, Bld: 109 mg/dL — ABNORMAL HIGH (ref 70–99)
Potassium: 5.2 mEq/L — ABNORMAL HIGH (ref 3.5–5.1)
Sodium: 140 mEq/L (ref 135–145)

## 2020-09-04 LAB — CBC WITH DIFFERENTIAL/PLATELET
Basophils Absolute: 0.1 10*3/uL (ref 0.0–0.1)
Basophils Relative: 0.6 % (ref 0.0–3.0)
Eosinophils Absolute: 0.6 10*3/uL (ref 0.0–0.7)
Eosinophils Relative: 5.7 % — ABNORMAL HIGH (ref 0.0–5.0)
HCT: 44.7 % (ref 39.0–52.0)
Hemoglobin: 14.9 g/dL (ref 13.0–17.0)
Lymphocytes Relative: 65.7 % — ABNORMAL HIGH (ref 12.0–46.0)
Lymphs Abs: 7 10*3/uL — ABNORMAL HIGH (ref 0.7–4.0)
MCHC: 33.4 g/dL (ref 30.0–36.0)
MCV: 86.1 fl (ref 78.0–100.0)
Monocytes Absolute: 0.3 10*3/uL (ref 0.1–1.0)
Monocytes Relative: 3.1 % (ref 3.0–12.0)
Neutro Abs: 2.7 10*3/uL (ref 1.4–7.7)
Neutrophils Relative %: 24.9 % — ABNORMAL LOW (ref 43.0–77.0)
Platelets: 307 10*3/uL (ref 150.0–400.0)
RBC: 5.18 Mil/uL (ref 4.22–5.81)
RDW: 15.7 % — ABNORMAL HIGH (ref 11.5–15.5)
WBC: 10.7 10*3/uL — ABNORMAL HIGH (ref 4.0–10.5)

## 2020-09-04 LAB — HEPATIC FUNCTION PANEL
ALT: 20 U/L (ref 0–53)
AST: 27 U/L (ref 0–37)
Albumin: 4.4 g/dL (ref 3.5–5.2)
Alkaline Phosphatase: 71 U/L (ref 39–117)
Bilirubin, Direct: 0.1 mg/dL (ref 0.0–0.3)
Total Bilirubin: 0.6 mg/dL (ref 0.2–1.2)
Total Protein: 6.6 g/dL (ref 6.0–8.3)

## 2020-09-04 LAB — LIPID PANEL
Cholesterol: 233 mg/dL — ABNORMAL HIGH (ref 0–200)
HDL: 76.5 mg/dL (ref >39.00)
LDL Cholesterol: 145 mg/dL — ABNORMAL HIGH (ref 0–99)
NonHDL: 156.06
Total CHOL/HDL Ratio: 3
Triglycerides: 55 mg/dL (ref 0.0–149.0)
VLDL: 11 mg/dL (ref 0.0–40.0)

## 2020-09-04 LAB — HEMOGLOBIN A1C: Hgb A1c MFr Bld: 5.8 % (ref 4.6–6.5)

## 2020-09-04 MED ORDER — PREDNISONE 20 MG PO TABS: ORAL_TABLET | ORAL | 0 refills | Status: DC

## 2020-09-04 NOTE — Progress Notes (Signed)
T. , MD, [place] at [facility] [place], [address]  Phone: [phone]  FAX: [phone]  KENIEL RALSTON - 65 y.o. male  MRN [id number]  Date of Birth: [birth year]  Date: 09/04/2020  PCP: Owens Loffler, MD  Referral: Owens Loffler, MD  Chief Complaint  Patient presents with  . Numbness    Bilateral arms   . Neck Pain    This visit occurred during the SARS-CoV-2 public health emergency.  Safety protocols were in place, including screening questions prior to the visit, additional usage of staff PPE, and extensive cleaning of exam room while observing appropriate contact time as indicated for disinfecting solutions.   Subjective:   FARHAD BURLESON is a 65 y.o. very pleasant male patient with Body mass index is 25.89 kg/m. who presents with the following:  Note this patient well.  He presents today with some neck pain and radiating pain down his arms.  Does have some subjective numbness as well.  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  He does have some neck pain without thoracic spine pain.  No significant lumbar spine pain.  No significant shoulder, elbow, or forearm pain.  He has not had any specific injury that he can recall.  He has only tried some basic over-the-counter's at home. He remains quite active at his age.  He also does have a history of CLL and he is not seeing his oncologist in some time.  Review of Systems is noted in the HPI, as appropriate   Objective:   BP 130/84   Pulse 63   Temp 98 F (36.7 C) (Temporal)   Ht 5' 5.5" (1.664 m)   Wt 158 lb (71.7 kg)   SpO2 94%   BMI 25.89 kg/m    GEN: alert,appropriate PSYCH: Normally interactive. Cooperative during the interview.   CERVICAL SPINE EXAM Range of motion: Flexion, extension, lateral bending, and rotation: He does have an  approximately 25% loss of motion in all directions Pain with terminal motion: Yes Spinous Processes: NT SCM: NT Upper paracervical muscles: Tender to palpation Upper traps: Mildly decreased C5-T1 intact, sensation and motor Grip is slightly less compared to the expected.  Radiology: DG Cervical Spine Complete  Result Date: 09/05/2020 CLINICAL DATA:  Cervical radiculopathy. EXAM: CERVICAL SPINE - COMPLETE 4+ VIEW COMPARISON:  None. FINDINGS: Minimal grade 1 anterolisthesis of C3-4 and C4-5 is noted secondary to posterior facet joint hypertrophy. Moderate degenerative disc disease is noted at C5-6 and C6-7. No fracture is noted. No prevertebral soft tissue swelling is noted. Moderate bilateral neural foraminal stenosis is noted at C3-4, C4-5 and C5-6 and C6-7 secondary to uncovertebral spurring. IMPRESSION: Moderate multilevel degenerative disc disease. Moderate bilateral neural foraminal stenosis is noted secondary to uncovertebral spurring. No acute abnormality seen in the cervical spine. Electronically Signed   By: Marijo Conception M.D.   On: 09/05/2020 14:37    Results for orders placed or performed in visit on 09/04/20  Lipid panel  Result Value Ref Range   Cholesterol 233 (H) 0 - 200 mg/dL   Triglycerides 55.0 0.0 - 149.0 mg/dL   HDL 76.50 >39.00 mg/dL   VLDL 11.0 0.0 - 40.0 mg/dL   LDL Cholesterol 145 (H) 0 - 99 mg/dL   Total CHOL/HDL Ratio 3    NonHDL 156.06   CBC with Differential/Platelet  Result Value Ref Range   WBC 10.7 (H) 4.0 - 10.5 K/uL   RBC 5.18 4.22 - 5.81 Mil/uL   Hemoglobin 14.9 13.0 - 17.0 g/dL   HCT 44.7 39.0 - 52.0 %   MCV 86.1 78.0 - 100.0 fl   MCHC 33.4 30.0 - 36.0 g/dL   RDW 15.7 (H) 11.5 - 15.5 %   Platelets 307.0 150.0 - 400.0 K/uL   Neutrophils Relative % 24.9 (L) 43.0 - 77.0 %   Lymphocytes Relative 65.7 (H) 12.0 - 46.0 %   Monocytes Relative 3.1 3.0 - 12.0 %   Eosinophils Relative 5.7 (H) 0.0 - 5.0 %   Basophils Relative 0.6 0.0 - 3.0 %   Neutro Abs  2.7 1.4 - 7.7 K/uL   Lymphs Abs 7.0 (H) 0.7 - 4.0 K/uL   Monocytes Absolute 0.3 0.1 - 1.0 K/uL   Eosinophils Absolute 0.6 0.0 - 0.7 K/uL   Basophils Absolute 0.1 0.0 - 0.1 K/uL  Hepatic function panel  Result Value Ref Range   Total Bilirubin 0.6 0.2 - 1.2 mg/dL   Bilirubin, Direct 0.1 0.0 - 0.3 mg/dL   Alkaline Phosphatase 71 39 - 117 U/L   AST 27 0 - 37 U/L   ALT 20 0 - 53 U/L   Total Protein 6.6 6.0 - 8.3 g/dL   Albumin 4.4 3.5 - 5.2 g/dL  Basic metabolic panel  Result Value Ref Range   Sodium 140 135 - 145 mEq/L   Potassium 5.2 No hemolysis seen (H) 3.5 - 5.1 mEq/L   Chloride 104 96 - 112 mEq/L   CO2 31 19 - 32 mEq/L   Glucose, Bld 109 (H) 70 - 99 mg/dL   BUN 18 6 - 23 mg/dL   Creatinine, Ser 0.99 0.40 - 1.50 mg/dL   GFR 81.63 >60.00 mL/min   Calcium 9.0 8.4 - 10.5 mg/dL  Hemoglobin A1c  Result Value Ref Range   Hgb A1c MFr Bld 5.8 4.6 - 6.5 %  PSA, Total with Reflex to PSA, Free  Result Value Ref Range   PSA, Total 1.7 < OR = 4.0 ng/mL     Assessment and Plan:     ICD-10-CM   1. Cervical radiculopathy  M54.12 DG Cervical Spine Complete  2. CLL (chronic lymphocytic leukemia) (HCC)  C91.10 CBC with Differential/Platelet  3. Screening, lipid  Z13.220 Lipid panel  4. Encounter for long-term (current) use of medications  Z79.899 Hepatic function panel  5. Screening for diabetes mellitus  F09.3 Basic metabolic panel    Hemoglobin A1c  6. Screening PSA (prostate specific antigen)  Z12.5 PSA, Total with Reflex to PSA, Free   Cervical radiculopathy in the setting of foraminal stenosis which would be the most likely culprit with multigenerative disc disease.  Multilevel.  Acute on chronic.  Pulse with steroids, and additional follow-up if symptoms persist.  He has not had follow-up for his leukemia in some time.  Recommend follow-up with oncology.  Obtain basic labs today. Relative lymphocytes continue to be elevated.  Meds ordered this encounter  Medications  .  predniSONE (DELTASONE) 20 MG tablet    Sig: 2 tabs po for 7 days, then 1 tab po for 7 days    Dispense:  21 tablet    Refill:  0   There are no discontinued medications. Orders Placed This Encounter  Procedures  . DG Cervical Spine Complete  . Lipid panel  . CBC with Differential/Platelet  . Hepatic function panel  . Basic metabolic panel  .  Hemoglobin A1c  . PSA, Total with Reflex to PSA, Free    Follow-up: Return in about 6 weeks (around 10/16/2020) for if neck is still bothering you.  Signed,  Maud Deed. , MD   Outpatient Encounter Medications as of 09/04/2020  Medication Sig  . hydrocortisone (ANUSOL-HC) 2.5 % rectal cream PLACE 1 APPLICATION RECTALLY TWICE DAILY  . predniSONE (DELTASONE) 20 MG tablet 2 tabs po for 7 days, then 1 tab po for 7 days  . sildenafil (REVATIO) 20 MG tablet TAKE 1 TO 5 TABLETS BY MOUTH 30 MINUTES PRIOR TO INTERCOURSE  . zolpidem (AMBIEN) 10 MG tablet TAKE ONE TABLET BY MOUTH EVERY NIGHT AT BEDTIME AS NEEDED   No facility-administered encounter medications on file as of 09/04/2020.

## 2020-09-05 LAB — PSA, TOTAL WITH REFLEX TO PSA, FREE: PSA, Total: 1.7 ng/mL (ref <=4.0)

## 2020-09-08 ENCOUNTER — Telehealth: Payer: Self-pay

## 2020-09-08 NOTE — Telephone Encounter (Signed)
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 Pt wants me to tell his wife what Dr Lorelei Pont said about 09/04/20 C spine xray. pts wife notified as instructed and she voiced understanding. Nothing further needed at this time.

## 2020-09-18 ENCOUNTER — Other Ambulatory Visit: Payer: Self-pay | Admitting: Family Medicine

## 2020-09-18 NOTE — Telephone Encounter (Signed)
Last office visit 09/04/2020 for neck pain and bilateral arm numbness.  Last refilled 10/16/2019 for 30 g with 2 refills.  No future appointments.

## 2020-12-19 ENCOUNTER — Other Ambulatory Visit: Payer: Self-pay | Admitting: Family Medicine

## 2020-12-19 NOTE — Telephone Encounter (Signed)
Last office visit 09/04/2020 for numbness/neck pain.  Last refilled 07/30/2020 for #30 with 5 refills.  No future appointments.

## 2021-02-17 ENCOUNTER — Encounter: Payer: Self-pay | Admitting: Gastroenterology

## 2021-06-29 ENCOUNTER — Encounter: Payer: Self-pay | Admitting: Family Medicine

## 2021-06-30 MED ORDER — AMOXICILLIN 875 MG PO TABS: 875.0000 mg | ORAL_TABLET | Freq: Two times a day (BID) | ORAL | 0 refills | Status: DC

## 2021-07-23 ENCOUNTER — Other Ambulatory Visit: Payer: Self-pay | Admitting: *Deleted

## 2021-07-23 NOTE — Telephone Encounter (Signed)
Last office visit 09/04/2020 for Numbness/Neck Pain.  Last refilled 12/21/20 for #30 with 5 refills.  No future appointments.

## 2021-07-24 MED ORDER — ZOLPIDEM TARTRATE 10 MG PO TABS: 10.0000 mg | ORAL_TABLET | Freq: Every evening | ORAL | 5 refills | Status: DC | PRN

## 2021-10-05 ENCOUNTER — Other Ambulatory Visit: Payer: Self-pay | Admitting: Family Medicine

## 2021-10-06 NOTE — Telephone Encounter (Signed)
Refill request for hydrocortisone (ANUSOL-HC) 2.5 % rectal cream ? ?LOV - 09/04/20 ?Next OV - not scheduled ?Last refill - 09/18/20 #30 g/2 ? ?

## 2021-11-24 ENCOUNTER — Encounter: Payer: Self-pay | Admitting: Family Medicine

## 2022-01-08 ENCOUNTER — Other Ambulatory Visit: Payer: Self-pay | Admitting: Family Medicine

## 2022-01-10 NOTE — Telephone Encounter (Signed)
Last office 09/04/20 for cervical radiculopathy.  Last refilled 07/24/21 for #30 with 5 refills.  No future appointments.   Please call and schedule CPE with fasting labs prior with Dr. Lorelei Pont.

## 2022-01-11 NOTE — Telephone Encounter (Signed)
Called patient and lvm for patient to call to schedule a physical.

## 2022-02-08 ENCOUNTER — Other Ambulatory Visit: Payer: Self-pay | Admitting: *Deleted

## 2022-02-08 MED ORDER — ZOLPIDEM TARTRATE 10 MG PO TABS
10.0000 mg | ORAL_TABLET | Freq: Every evening | ORAL | 0 refills | Status: DC | PRN
Start: 2022-02-08 — End: 2022-03-10

## 2022-02-08 NOTE — Telephone Encounter (Signed)
Last office visit 09/04/2020 for numbness/neck pain.  Last refilled 01/11/2022 for #30 with no refills.  CPE scheduled 02/18/22.

## 2022-02-09 ENCOUNTER — Ambulatory Visit (INDEPENDENT_AMBULATORY_CARE_PROVIDER_SITE_OTHER): Payer: 59 | Admitting: Dermatology

## 2022-02-09 ENCOUNTER — Encounter: Payer: Self-pay | Admitting: Dermatology

## 2022-02-09 DIAGNOSIS — L739 Follicular disorder, unspecified: Secondary | ICD-10-CM | POA: Diagnosis not present

## 2022-02-09 DIAGNOSIS — L578 Other skin changes due to chronic exposure to nonionizing radiation: Secondary | ICD-10-CM

## 2022-02-09 DIAGNOSIS — D485 Neoplasm of uncertain behavior of skin: Secondary | ICD-10-CM

## 2022-02-09 DIAGNOSIS — L570 Actinic keratosis: Secondary | ICD-10-CM

## 2022-02-09 DIAGNOSIS — D492 Neoplasm of unspecified behavior of bone, soft tissue, and skin: Secondary | ICD-10-CM

## 2022-02-09 DIAGNOSIS — C4491 Basal cell carcinoma of skin, unspecified: Secondary | ICD-10-CM

## 2022-02-09 DIAGNOSIS — C44319 Basal cell carcinoma of skin of other parts of face: Secondary | ICD-10-CM | POA: Diagnosis not present

## 2022-02-09 DIAGNOSIS — L821 Other seborrheic keratosis: Secondary | ICD-10-CM

## 2022-02-09 HISTORY — DX: Basal cell carcinoma of skin, unspecified: C44.91

## 2022-02-09 MED ORDER — MUPIROCIN 2 % EX OINT: 1.0000 | TOPICAL_OINTMENT | Freq: Every day | CUTANEOUS | 0 refills | Status: DC

## 2022-02-09 NOTE — Patient Instructions (Addendum)
Recommend Niacinamide or Nicotinamide '500mg'$  twice per day to lower risk of non-melanoma skin cancer by approximately 25%. This is usually available at [employer].  Recommend taking Heliocare sun protection supplement daily in sunny weather for additional sun protection. For maximum protection on the sunniest days, you can take up to 2 capsules of regular Heliocare OR take 1 capsule of Heliocare Ultra. For prolonged exposure (such as a full day in the sun), you can repeat your dose of the supplement 4 hours after your first dose. Heliocare can be purchased at [employer], at some [facility] or at [email or web address].    Cryotherapy Aftercare  Wash gently with soap and water everyday.   Apply Vaseline and Band-Aid daily until healed.  Recommend daily broad spectrum sunscreen SPF 30+ to sun-exposed areas, reapply every 2 hours as needed. Call for new or changing lesions.  Staying in the shade or wearing long sleeves, sun glasses (UVA+UVB protection) and wide brim hats (4-inch brim around the entire circumference of the hat) are also recommended for sun protection.   Wound Care Instructions  Cleanse wound gently with soap and water once a day then pat dry with clean gauze. Apply a thin coat of Petrolatum (petroleum jelly, "Vaseline") over the wound (unless you have an allergy to this). We recommend that you use a new, sterile tube of Vaseline. Do not pick or remove scabs. Do not remove the yellow or white "healing tissue" from the base of the wound.  Cover the wound with fresh, clean, nonstick gauze and secure with paper tape. You may use Band-Aids in place of gauze and tape if the wound is small enough, but would recommend trimming much of the tape off as there is often too much. Sometimes Band-Aids can irritate the skin.  You should call the office for your biopsy report after 1 week if you have not already been contacted.  If you experience any problems, such as abnormal amounts of  bleeding, swelling, significant bruising, significant pain, or evidence of infection, please call the office immediately.  FOR ADULT SURGERY PATIENTS: If you need something for pain relief you may take 1 extra strength Tylenol (acetaminophen) AND 2 Ibuprofen ('200mg'$  each) together every 4 hours as needed for pain. (do not take these if you are allergic to them or if you have a reason you should not take them.) Typically, you may only need pain medication for 1 to 3 days.     Due to recent changes in healthcare laws, you may see results of your pathology and/or laboratory studies on MyChart before the doctors have had a chance to review them. We understand that in some cases there may be results that are confusing or concerning to you. Please understand that not all results are received at the same time and often the doctors may need to interpret multiple results in order to provide you with the best plan of care or course of treatment. Therefore, we ask that you please give [place] 2 business days to thoroughly review all your results before contacting the office for clarification. Should we see a critical lab result, you will be contacted sooner.   If You Need Anything After Your Visit  If you have any questions or concerns for your doctor, please call our main line at [phone] and press option 4 to reach your doctor's medical assistant. If no one answers, please leave a voicemail as directed and we will return your call as soon as possible. Messages left after 4  pm will be answered the following business day.   You may also send [place] a message via [place]. We typically respond to MyChart messages within 1-2 business days.  For prescription refills, please ask your pharmacy to contact our office. Our fax number is [phone].  If you have an urgent issue when the clinic is closed that cannot wait until the next business day, you can page your doctor at the number below.    Please note that while we  do our best to be available for urgent issues outside of office hours, we are not available 24/7.   If you have an urgent issue and are unable to reach [place], you may choose to seek medical care at your doctor's office, retail clinic, urgent care center, or emergency room.  If you have a medical emergency, please immediately call 911 or go to the emergency department.  Pager Numbers  - Dr. Nehemiah Massed: [phone]  - Dr. Laurence Ferrari: [phone]  - Dr. Nicole Kindred: [phone]  In the event of inclement weather, please call our main line at [phone] for an update on the status of any delays or closures.  Dermatology Medication Tips: Please keep the boxes that topical medications come in in order to help keep track of the instructions about where and how to use these. Pharmacies typically print the medication instructions only on the boxes and not directly on the medication tubes.   If your medication is too expensive, please contact our office at [phone] option 4 or send [place] a message through [employer].   We are unable to tell what your co-pay for medications will be in advance as this is different depending on your insurance coverage. However, we may be able to find a substitute medication at lower cost or fill out paperwork to get insurance to cover a needed medication.   If a prior authorization is required to get your medication covered by your insurance company, please allow [place] 1-2 business days to complete this process.  Drug prices often vary depending on where the prescription is filled and some pharmacies may offer cheaper prices.  The website www.goodrx.com contains coupons for medications through different pharmacies. The prices here do not account for what the cost may be with help from insurance (it may be cheaper with your insurance), but the website can give you the price if you did not use any insurance.  - You can print the associated coupon and take it with your prescription to  the pharmacy.  - You may also stop by our office during regular business hours and pick up a GoodRx coupon card.  - If you need your prescription sent electronically to a different pharmacy, notify our office through [facility] or by phone at [phone] option 4.     Si Usted Necesita Algo Despus de Su Visita  Tambin puede enviarnos un mensaje a travs de Pharmacist, community. Por lo general respondemos a los mensajes de MyChart en el transcurso de 1 a 2 das hbiles.  Para renovar recetas, por favor pida a su farmacia que se ponga en contacto con nuestra oficina. Harland Dingwall de fax es [place] [phone].  Si tiene un asunto urgente cuando la clnica est cerrada y que no puede esperar hasta el siguiente da hbil, puede llamar/localizar a su doctor(a) al nmero que aparece a continuacin.   Por favor, tenga en cuenta que aunque hacemos todo lo posible para estar disponibles para asuntos urgentes fuera del horario de oficina, no estamos disponibles las 24  horas del Training and development officer, los 7 das de la [place].   Si tiene un problema urgente y no puede comunicarse con nosotros, puede optar por buscar atencin mdica  en el consultorio de su doctor(a), en una clnica privada, en un centro de atencin urgente o en una sala de emergencias.  Si tiene Engineering geologist, por favor llame inmediatamente al 911 o vaya a la sala de emergencias.  Nmeros de bper  - Dr. Nehemiah Massed: [phone]  - Dra. Moye: [phone]  - Dra. Nicole Kindred: [phone]  En caso de inclemencias del [place], por favor llame a Johnsie Kindred principal al [phone] para una actualizacin sobre el [place] de cualquier retraso o cierre.  Consejos para la medicacin en dermatologa: Por favor, guarde las cajas en las que vienen los medicamentos de uso tpico para ayudarle a seguir las instrucciones sobre dnde y cmo usarlos. Las farmacias generalmente imprimen las instrucciones del medicamento slo en las cajas y no directamente en  los tubos del [place].   Si su medicamento es muy caro, por favor, pngase en contacto con Zigmund Daniel llamando al [phone] y presione la opcin 4 o envenos un mensaje a travs de Pharmacist, community.   No podemos decirle cul ser su copago por los medicamentos por adelantado ya que esto es diferente dependiendo de la cobertura de su seguro. Sin embargo, es posible que podamos encontrar un medicamento sustituto a Electrical engineer un formulario para que el seguro cubra el medicamento que se considera necesario.   Si se requiere una autorizacin previa para que su compaa de seguros [place] su medicamento, por favor permtanos de 1 a 2 das hbiles para completar este proceso.  Los precios de los medicamentos varan con frecuencia dependiendo del Environmental consultant de dnde se surte la receta y alguna farmacias pueden ofrecer precios ms baratos.  El sitio web www.goodrx.com tiene cupones para medicamentos de Airline pilot. Los precios aqu no tienen en cuenta lo que podra costar con la ayuda del seguro (puede ser ms barato con su seguro), pero el sitio web puede darle el precio si no utiliz Research scientist (physical sciences).  - Puede imprimir el cupn correspondiente y llevarlo con su receta a la farmacia.  - Tambin puede pasar por nuestra oficina durante el horario de atencin regular y Charity fundraiser una tarjeta de cupones de GoodRx.  - Si necesita que su receta se enve electrnicamente a una farmacia diferente, informe a nuestra oficina a travs de MyChart de [place] o por telfono llamando al [phone] y presione la opcin 4.

## 2022-02-09 NOTE — Progress Notes (Unsigned)
New Patient Visit  Subjective  Philip Tran is a 66 y.o. male who presents for the following: Crusted spot (Right cheek x months. Area is sore and never goes away. No bleeding. ) and Growth (Right chest x years.).  Patient accompanied by wife.   The following portions of the chart were reviewed this encounter and updated as appropriate:   Tobacco  Allergies  Meds  Problems  Med Hx  Surg Hx  Fam Hx      Review of Systems:  No other skin or systemic complaints except as noted in HPI or Assessment and Plan.  Objective  Well appearing patient in no apparent distress; mood and affect are within normal limits.  A focused examination was performed including face, chest. Relevant physical exam findings are noted in the Assessment and Plan.  right cheek 1.4 cm scaly pink plaque with ulceration     right alar crease 0.3 cm pink papule     left jaw x 1 Erythematous thin papules/macules with gritty scale.   right chest Stuck-on, waxy, tan-brown papule or plaque --Discussed benign etiology and prognosis.     Assessment & Plan  Neoplasm of uncertain behavior of skin right cheek  Skin / nail biopsy Type of biopsy: tangential   Informed consent: discussed and consent obtained   Timeout: patient name, date of birth, surgical site, and procedure verified   Procedure prep:  Patient was prepped and draped in usual sterile fashion Prep type:  Isopropyl alcohol Anesthesia: the lesion was anesthetized in a standard fashion   Anesthetic:  1% lidocaine w/ epinephrine 1-100,000 buffered w/ 8.4% NaHCO3 Instrument used: flexible razor blade   Hemostasis achieved with: aluminum chloride   Outcome: patient tolerated procedure well   Post-procedure details: wound care instructions given   Additional details:  Petrolatum and a pressure bandage applied  mupirocin ointment (BACTROBAN) 2 % Apply 1 Application topically daily.  Specimen 1 - Surgical pathology Differential  Diagnosis: r/o BCC  Check Margins: No 1.4 cm scaly pink plaque with ulceration  Will plan Mohs at [place] if + for [facility].   Neoplasm of skin right alar crease  Skin / nail biopsy Type of biopsy: tangential   Informed consent: discussed and consent obtained   Timeout: patient name, date of birth, surgical site, and procedure verified   Procedure prep:  Patient was prepped and draped in usual sterile fashion Prep type:  Isopropyl alcohol Anesthesia: the lesion was anesthetized in a standard fashion   Anesthetic:  1% lidocaine w/ epinephrine 1-100,000 buffered w/ 8.4% NaHCO3 Instrument used: flexible razor blade   Hemostasis achieved with: aluminum chloride   Outcome: patient tolerated procedure well   Post-procedure details: wound care instructions given   Additional details:  Petrolatum and a pressure bandage applied  Specimen 2 - Surgical pathology Differential Diagnosis: r/o BCC  Check Margins: No 0.3 cm pink papule  AK (actinic keratosis) left jaw x 1  Actinic keratoses are precancerous spots that appear secondary to cumulative UV radiation exposure/sun exposure over time. They are chronic with expected duration over 1 year. A portion of actinic keratoses will progress to squamous cell carcinoma of the skin. It is not possible to reliably predict which spots will progress to skin cancer and so treatment is recommended to prevent development of skin cancer.  Recommend daily broad spectrum sunscreen SPF 30+ to sun-exposed areas, reapply every 2 hours as needed.  Recommend staying in the shade or wearing long sleeves, sun glasses (UVA+UVB  protection) and wide brim hats (4-inch brim around the entire circumference of the hat). Call for new or changing lesions.  Prior to procedure, discussed risks of blister formation, small wound, skin dyspigmentation, or rare scar following cryotherapy. Recommend Vaseline ointment to treated areas while healing.   Destruction of  lesion - left jaw x 1  Destruction method: cryotherapy   Informed consent: discussed and consent obtained   Lesion destroyed using liquid nitrogen: Yes   Cryotherapy cycles:  2 Outcome: patient tolerated procedure well with no complications   Post-procedure details: wound care instructions given    Seborrheic keratosis right chest  Cosmetically bothersome  Signed cosmetic non-covered consent  Prior to procedure, discussed risks of blister formation, small wound, skin dyspigmentation, or rare scar following cryotherapy. Recommend Vaseline ointment to treated areas while healing.   Destruction of lesion - right chest  Destruction method: cryotherapy   Informed consent: discussed and consent obtained   Lesion destroyed using liquid nitrogen: Yes   Cryotherapy cycles:  2 Outcome: patient tolerated procedure well with no complications   Post-procedure details: wound care instructions given    Actinic Damage - chronic, secondary to cumulative UV radiation exposure/sun exposure over time - diffuse scaly erythematous macules with underlying dyspigmentation - Recommend daily broad spectrum sunscreen SPF 30+ to sun-exposed areas, reapply every 2 hours as needed.  - Recommend staying in the shade or wearing long sleeves, sun glasses (UVA+UVB protection) and wide brim hats (4-inch brim around the entire circumference of the hat). - Call for new or changing lesions.   Return for TBSE, as scheduled, SK follow up.  Graciella Belton, RMA, am acting as scribe for Forest Gleason, MD .  Documentation: I have reviewed the above documentation for accuracy and completeness, and I agree with the above.  Forest Gleason, MD

## 2022-02-10 ENCOUNTER — Encounter: Payer: Self-pay | Admitting: Dermatology

## 2022-02-12 ENCOUNTER — Encounter: Payer: Self-pay | Admitting: Dermatology

## 2022-02-16 ENCOUNTER — Telehealth: Payer: Self-pay

## 2022-02-16 DIAGNOSIS — C44319 Basal cell carcinoma of skin of other parts of face: Secondary | ICD-10-CM

## 2022-02-16 NOTE — Telephone Encounter (Signed)
-----   Message from Alfonso Patten, MD sent at 02/11/2022  5:32 PM EDT ----- 1. Skin , right cheek BASAL CELL CARCINOMA, NODULAR PATTERN, BASE INVOLVED --> Mohs surgery  2. Skin , right alar crease CHRONIC FOLLICULITIS  MAs please call to discuss results and refer. Let me know if he has questions. Thank you!

## 2022-02-16 NOTE — Telephone Encounter (Signed)
Patient's wife advised bx results, referral sent to [place]. Lurlean Horns., RMA

## 2022-02-18 ENCOUNTER — Encounter: Payer: 59 | Admitting: Family Medicine

## 2022-02-18 ENCOUNTER — Ambulatory Visit (INDEPENDENT_AMBULATORY_CARE_PROVIDER_SITE_OTHER): Payer: 59 | Admitting: Family Medicine

## 2022-02-18 ENCOUNTER — Encounter: Payer: Self-pay | Admitting: Family Medicine

## 2022-02-18 VITALS — BP 118/80 | HR 69 | Temp 98.5°F | Ht 65.75 in | Wt 170.1 lb

## 2022-02-18 DIAGNOSIS — Z0000 Encounter for general adult medical examination without abnormal findings: Secondary | ICD-10-CM

## 2022-02-18 DIAGNOSIS — Z13220 Encounter for screening for lipoid disorders: Secondary | ICD-10-CM

## 2022-02-18 DIAGNOSIS — Z79899 Other long term (current) drug therapy: Secondary | ICD-10-CM

## 2022-02-18 DIAGNOSIS — Z1211 Encounter for screening for malignant neoplasm of colon: Secondary | ICD-10-CM

## 2022-02-18 DIAGNOSIS — C9110 Chronic lymphocytic leukemia of B-cell type not having achieved remission: Secondary | ICD-10-CM

## 2022-02-18 DIAGNOSIS — Z125 Encounter for screening for malignant neoplasm of prostate: Secondary | ICD-10-CM

## 2022-02-18 DIAGNOSIS — Z131 Encounter for screening for diabetes mellitus: Secondary | ICD-10-CM

## 2022-02-18 NOTE — Progress Notes (Signed)
Dalynn Jhaveri T. Ariann Khaimov, MD, [place] at [facility] [place], [address]  Phone: [phone]  FAX: [address] - 66 y.o. male  MRN [id number]  Date of Birth: 01-03-1956  Date: 02/18/2022  PCP: Owens Loffler, MD  Referral: Owens Loffler, MD  Chief Complaint  Patient presents with   Annual Exam   Patient Care Team: Owens Loffler, MD as PCP - General Subjective:   KUE FOX is a 66 y.o. pleasant patient who presents with the following:  Preventative Health Maintenance Visit:  Health Maintenance Summary Reviewed and updated, unless pt declines services.  Tobacco History Reviewed. Alcohol: No concerns, no excessive use Exercise Habits: Some activity, rec at least 30 mins 5 times a week STD concerns: no risk or activity to increase risk Drug Use: None  Shingrix New Covid booster Colonoscopy or cologuard - will call about setting up colonoscopy Tdap Flu He will defer all  Working driving a truck now locally.   Health Maintenance  Topic Date Due   COVID-19 Vaccine (3 - Moderna risk series) 10/31/2019   COLONOSCOPY (Pts 45-110yr Insurance coverage will need to be confirmed)  11/11/2020   Zoster Vaccines- Shingrix (1 of 2) 05/20/2022 (Originally 02/25/1975)   INFLUENZA VACCINE  09/12/2022 (Originally 01/12/2022)   TETANUS/TDAP  02/19/2023 (Originally 05/06/2021)   Hepatitis C Screening  Completed   HIV Screening  Completed   HPV VACCINES  Aged Out   Immunization History  Administered Date(s) Administered   Hepatitis A, Adult 09/03/2010, 08/24/2011   Influenza Split 03/03/2011   Influenza Whole 04/12/2008, 03/12/2009   Influenza, Seasonal, Injecte, Preservative Fre 03/13/2012   Influenza,inj,Quad PF,6+ Mos 01/31/2014, 03/06/2015, 03/05/2016, 04/12/2017, 03/02/2018, 03/06/2019   Influenza,inj,quad, With Preservative 03/23/2013   Moderna Sars-Covid-2 Vaccination 09/05/2019,  10/03/2019   Tdap 05/07/2011   Patient Active Problem List   Diagnosis Date Noted   CLL (chronic lymphocytic leukemia) ([facility] 03/03/2011    Priority: High   Chronic hepatitis C virus infection ([place] 12/10/2009    Priority: High   History of total right hip arthroplasty 02/01/2014   Primary osteoarthritis of left hip 02/01/2014   Rheumatoid arthritis(714.0) 03/05/2011   Past Medical History:  Diagnosis Date   BCC (basal cell carcinoma) 02/09/2022   right cheek, Mohs   CLL (chronic lymphoblastic leukemia) 01/12/2010   Dr. KHumphrey Rolls  Hemorrhoid    Hepatitis B infection    Prior but patient not chronically infected   Hepatitis C antibody test positive    Chronic Hep C   History of total right hip arthroplasty 02/01/2014   Rheumatoid arthritis ([place]     Past Surgical History:  Procedure Laterality Date   HERNIA REPAIR     TOTAL HIP ARTHROPLASTY Right 2014   [facility]    No family history on file.  Social History   Social History Narrative   Avid bAssociate Professor golfer, sEngineer, maintenance (IT)   History of remote IV drug use, clean for many years now.    Past Medical History, Surgical History, Social History, Family History, Problem List, Medications, and Allergies have been reviewed and updated if relevant.  Review of Systems: Pertinent positives are listed above.  Otherwise, a full 14 point review of systems has been done in full and it is negative except where it is noted positive.  Objective:   BP 118/80   Pulse 69   Temp 98.5 F (36.9 C) (Oral)   Ht 5' 5.75" (1.67 m)  Wt 170 lb 2 oz (77.2 kg)   SpO2 95%   BMI 27.67 kg/m  Ideal Body Weight: Weight in (lb) to have BMI = 25: 153.4  Ideal Body Weight: Weight in (lb) to have BMI = 25: 153.4 No results found.    02/18/2022    2:50 PM 08/15/2019    2:52 PM 04/21/2017   12:10 PM  Depression screen PHQ 2/9  Decreased Interest 0 0 0  Down, Depressed, Hopeless 0 0 0  PHQ - 2 Score 0 0 0     GEN: well developed,  well nourished, no acute distress Eyes: conjunctiva and lids normal, PERRLA, EOMI ENT: TM clear, nares clear, oral exam WNL Neck: supple, no lymphadenopathy, no thyromegaly, no JVD Pulm: clear to auscultation and percussion, respiratory effort normal CV: regular rate and rhythm, S1-S2, no murmur, rub or gallop, no bruits, peripheral pulses normal and symmetric, no cyanosis, clubbing, edema or varicosities GI: soft, non-tender; no hepatosplenomegaly, masses; active bowel sounds all quadrants GU: deferred Lymph: no cervical, axillary or inguinal adenopathy MSK: gait normal, muscle tone and strength WNL, no joint swelling, effusions, discoloration, crepitus  SKIN: clear, good turgor, color WNL, no rashes, lesions, or ulcerations Neuro: normal mental status, normal strength, sensation, and motion Psych: alert; oriented to person, place and time, normally interactive and not anxious or depressed in appearance.  All labs reviewed with patient. Results for orders placed or performed in visit on 84/69/62  Basic metabolic panel  Result Value Ref Range   Sodium 144 135 - 145 mEq/L   Potassium 4.8 3.5 - 5.1 mEq/L   Chloride 106 96 - 112 mEq/L   CO2 29 19 - 32 mEq/L   Glucose, Bld 88 70 - 99 mg/dL   BUN 19 6 - 23 mg/dL   Creatinine, Ser 1.10 0.40 - 1.50 mg/dL   GFR 71.20 >60.00 mL/min   Calcium 9.9 8.4 - 10.5 mg/dL  CBC with Differential/Platelet  Result Value Ref Range   WBC 16.6 (H) 4.0 - 10.5 K/uL   RBC 5.40 4.22 - 5.81 Mil/uL   Hemoglobin 14.9 13.0 - 17.0 g/dL   HCT 45.3 39.0 - 52.0 %   MCV 83.9 78.0 - 100.0 fl   MCHC 33.0 30.0 - 36.0 g/dL   RDW 14.8 11.5 - 15.5 %   Platelets 259.0 150.0 - 400.0 K/uL   Neutrophils Relative % 19.5 (L) 43.0 - 77.0 %   Lymphocytes Relative 72.7 Repeated and verified X2. (H) 12.0 - 46.0 %   Monocytes Relative 3.5 3.0 - 12.0 %   Eosinophils Relative 3.2 0.0 - 5.0 %   Basophils Relative 1.1 0.0 - 3.0 %   Neutro Abs 3.2 1.4 - 7.7 K/uL   Lymphs Abs 12.1  (H) 0.7 - 4.0 K/uL   Monocytes Absolute 0.6 0.1 - 1.0 K/uL   Eosinophils Absolute 0.5 0.0 - 0.7 K/uL   Basophils Absolute 0.2 (H) 0.0 - 0.1 K/uL  Hepatic function panel  Result Value Ref Range   Total Bilirubin 0.4 0.2 - 1.2 mg/dL   Bilirubin, Direct 0.1 0.0 - 0.3 mg/dL   Alkaline Phosphatase 90 39 - 117 U/L   AST 34 0 - 37 U/L   ALT 44 0 - 53 U/L   Total Protein 6.9 6.0 - 8.3 g/dL   Albumin 4.5 3.5 - 5.2 g/dL  Lipid panel  Result Value Ref Range   Cholesterol 249 (H) 0 - 200 mg/dL   Triglycerides 172.0 (H) 0.0 - 149.0 mg/dL  HDL 38.70 (L) >39.00 mg/dL   VLDL 34.4 0.0 - 40.0 mg/dL   LDL Cholesterol 176 (H) 0 - 99 mg/dL   Total CHOL/HDL Ratio 6    NonHDL 210.66     Assessment and Plan:     ICD-10-CM   1. Healthcare maintenance  Z00.00     2. Screening for malignant neoplasm of prostate  Z12.5 PSA, Total with Reflex to PSA, Free    3. Screening for malignant neoplasm of colon  Z12.11     4. Screening, lipid  Z13.220 Lipid panel    5. Screening for diabetes mellitus  Z13.1     6. Encounter for long-term (current) use of medications  Z61.096 Basic metabolic panel    Hepatic function panel    7. CLL (chronic lymphocytic leukemia) (HCC)  C91.10 CBC with Differential/Platelet     We will check all of his baseline labs.  Of concern, he does have CLL, and he is not been following with oncology.  He will defer all of the below health maintenance.  He has a very high deductible with his medical insurance, and he would like to defer all of this until he potentially has Medicare. Shingrix New Covid booster Colonoscopy or cologuard - will call about setting up colonoscopy Tdap Flu  We will get baseline laboratories today.  Health Maintenance Exam: The patient's preventative maintenance and recommended screening tests for an annual wellness exam were reviewed in full today. Brought up to date unless services declined.  Counselled on the importance of diet, exercise, and its  role in overall health and mortality. The patient's FH and SH was reviewed, including their home life, tobacco status, and drug and alcohol status.  Follow-up in 1 year for physical exam or additional follow-up below.  Disposition: No follow-ups on file.  No orders of the defined types were placed in this encounter.  Medications Discontinued During This Encounter  Medication Reason   predniSONE (DELTASONE) 20 MG tablet Completed Course   Orders Placed This Encounter  Procedures   Basic metabolic panel   CBC with Differential/Platelet   Hepatic function panel   Lipid panel   PSA, Total with Reflex to PSA, Free    Signed,  Adan Beal T. Albina Gosney, MD   Allergies as of 02/18/2022       Reactions   Codeine Nausea Only        Medication List        Accurate as of February 18, 2022 11:59 PM. If you have any questions, ask your nurse or doctor.          STOP taking these medications    predniSONE 20 MG tablet Commonly known as: DELTASONE Stopped by: Owens Loffler, MD       TAKE these medications    hydrocortisone 2.5 % rectal cream Commonly known as: ANUSOL-HC PLACE 1 APPLICATION RECTALLY TWICE DAILY   mupirocin ointment 2 % Commonly known as: BACTROBAN Apply 1 Application topically daily.   zolpidem 10 MG tablet Commonly known as: AMBIEN Take 1 tablet (10 mg total) by mouth at bedtime as needed.

## 2022-02-19 LAB — LIPID PANEL
Cholesterol: 249 mg/dL — ABNORMAL HIGH (ref 0–200)
HDL: 38.7 mg/dL — ABNORMAL LOW (ref >39.00)
LDL Cholesterol: 176 mg/dL — ABNORMAL HIGH (ref 0–99)
NonHDL: 210.66
Total CHOL/HDL Ratio: 6
Triglycerides: 172 mg/dL — ABNORMAL HIGH (ref 0.0–149.0)
VLDL: 34.4 mg/dL (ref 0.0–40.0)

## 2022-02-19 LAB — CBC WITH DIFFERENTIAL/PLATELET
Basophils Absolute: 0.2 10*3/uL — ABNORMAL HIGH (ref 0.0–0.1)
Basophils Relative: 1.1 % (ref 0.0–3.0)
Eosinophils Absolute: 0.5 10*3/uL (ref 0.0–0.7)
Eosinophils Relative: 3.2 % (ref 0.0–5.0)
HCT: 45.3 % (ref 39.0–52.0)
Hemoglobin: 14.9 g/dL (ref 13.0–17.0)
Lymphocytes Relative: 72.7 % — ABNORMAL HIGH (ref 12.0–46.0)
Lymphs Abs: 12.1 10*3/uL — ABNORMAL HIGH (ref 0.7–4.0)
MCHC: 33 g/dL (ref 30.0–36.0)
MCV: 83.9 fl (ref 78.0–100.0)
Monocytes Absolute: 0.6 10*3/uL (ref 0.1–1.0)
Monocytes Relative: 3.5 % (ref 3.0–12.0)
Neutro Abs: 3.2 10*3/uL (ref 1.4–7.7)
Neutrophils Relative %: 19.5 % — ABNORMAL LOW (ref 43.0–77.0)
Platelets: 259 10*3/uL (ref 150.0–400.0)
RBC: 5.4 Mil/uL (ref 4.22–5.81)
RDW: 14.8 % (ref 11.5–15.5)
WBC: 16.6 10*3/uL — ABNORMAL HIGH (ref 4.0–10.5)

## 2022-02-19 LAB — BASIC METABOLIC PANEL
BUN: 19 mg/dL (ref 6–23)
CO2: 29 mEq/L (ref 19–32)
Calcium: 9.9 mg/dL (ref 8.4–10.5)
Chloride: 106 mEq/L (ref 96–112)
Creatinine, Ser: 1.1 mg/dL (ref 0.40–1.50)
GFR: 71.2 mL/min (ref >60.00)
Glucose, Bld: 88 mg/dL (ref 70–99)
Potassium: 4.8 mEq/L (ref 3.5–5.1)
Sodium: 144 mEq/L (ref 135–145)

## 2022-02-19 LAB — HEPATIC FUNCTION PANEL
ALT: 44 U/L (ref 0–53)
AST: 34 U/L (ref 0–37)
Albumin: 4.5 g/dL (ref 3.5–5.2)
Alkaline Phosphatase: 90 U/L (ref 39–117)
Bilirubin, Direct: 0.1 mg/dL (ref 0.0–0.3)
Total Bilirubin: 0.4 mg/dL (ref 0.2–1.2)
Total Protein: 6.9 g/dL (ref 6.0–8.3)

## 2022-02-19 LAB — PSA, TOTAL WITH REFLEX TO PSA, FREE: PSA, Total: 1.2 ng/mL (ref <=4.0)

## 2022-03-09 ENCOUNTER — Other Ambulatory Visit: Payer: Self-pay | Admitting: Family Medicine

## 2022-03-09 NOTE — Telephone Encounter (Signed)
Last office visit 02/18/22 for CPE.  Last refilled 02/08/22 for #30 with no refills.  No future appointments with PCP.

## 2022-03-23 ENCOUNTER — Encounter: Payer: Self-pay | Admitting: Dermatology

## 2022-03-27 ENCOUNTER — Ambulatory Visit: Payer: 59 | Admitting: Dermatology

## 2022-03-28 ENCOUNTER — Other Ambulatory Visit: Payer: Self-pay

## 2022-03-28 ENCOUNTER — Encounter: Payer: Self-pay | Admitting: Intensive Care

## 2022-03-28 ENCOUNTER — Encounter: Payer: Self-pay | Admitting: Family Medicine

## 2022-03-28 ENCOUNTER — Emergency Department: Payer: 59

## 2022-03-28 ENCOUNTER — Emergency Department
Admission: EM | Admit: 2022-03-28 | Discharge: 2022-03-28 | Disposition: A | Discharge status: Home / Self Care | Payer: 59 | Attending: Emergency Medicine | Admitting: Emergency Medicine

## 2022-03-28 DIAGNOSIS — Z856 Personal history of leukemia: Secondary | ICD-10-CM | POA: Diagnosis not present

## 2022-03-28 DIAGNOSIS — R300 Dysuria: Secondary | ICD-10-CM | POA: Insufficient documentation

## 2022-03-28 DIAGNOSIS — R590 Localized enlarged lymph nodes: Secondary | ICD-10-CM | POA: Insufficient documentation

## 2022-03-28 DIAGNOSIS — R110 Nausea: Secondary | ICD-10-CM | POA: Diagnosis not present

## 2022-03-28 DIAGNOSIS — R1031 Right lower quadrant pain: Secondary | ICD-10-CM | POA: Insufficient documentation

## 2022-03-28 DIAGNOSIS — E119 Type 2 diabetes mellitus without complications: Secondary | ICD-10-CM | POA: Diagnosis not present

## 2022-03-28 LAB — COMPREHENSIVE METABOLIC PANEL
ALT: 46 U/L — ABNORMAL HIGH (ref 0–44)
AST: 31 U/L (ref 15–41)
Albumin: 4.1 g/dL (ref 3.5–5.0)
Alkaline Phosphatase: 87 U/L (ref 38–126)
Anion gap: 4 — ABNORMAL LOW (ref 5–15)
BUN: 19 mg/dL (ref 8–23)
CO2: 29 mmol/L (ref 22–32)
Calcium: 9 mg/dL (ref 8.9–10.3)
Chloride: 107 mmol/L (ref 98–111)
Creatinine, Ser: 1.24 mg/dL (ref 0.61–1.24)
GFR, Estimated: >60 mL/min (ref >60)
Glucose, Bld: 125 mg/dL — ABNORMAL HIGH (ref 70–99)
Potassium: 5.3 mmol/L — ABNORMAL HIGH (ref 3.5–5.1)
Sodium: 140 mmol/L (ref 135–145)
Total Bilirubin: 0.5 mg/dL (ref 0.3–1.2)
Total Protein: 6.8 g/dL (ref 6.5–8.1)

## 2022-03-28 LAB — CBC WITH DIFFERENTIAL/PLATELET
Abs Immature Granulocytes: 0.03 10*3/uL (ref 0.00–0.07)
Basophils Absolute: 0.1 10*3/uL (ref 0.0–0.1)
Basophils Relative: 1 %
Eosinophils Absolute: 0.4 10*3/uL (ref 0.0–0.5)
Eosinophils Relative: 3 %
HCT: 48.4 % (ref 39.0–52.0)
Hemoglobin: 15.4 g/dL (ref 13.0–17.0)
Immature Granulocytes: 0 %
Lymphocytes Relative: 53 %
Lymphs Abs: 8.1 10*3/uL — ABNORMAL HIGH (ref 0.7–4.0)
MCH: 26.9 pg (ref 26.0–34.0)
MCHC: 31.8 g/dL (ref 30.0–36.0)
MCV: 84.6 fL (ref 80.0–100.0)
Monocytes Absolute: 1.4 10*3/uL — ABNORMAL HIGH (ref 0.1–1.0)
Monocytes Relative: 9 %
Neutro Abs: 5.1 10*3/uL (ref 1.7–7.7)
Neutrophils Relative %: 34 %
Platelets: 263 10*3/uL (ref 150–400)
RBC: 5.72 MIL/uL (ref 4.22–5.81)
RDW: 15.1 % (ref 11.5–15.5)
Smear Review: NORMAL
WBC: 15 10*3/uL — ABNORMAL HIGH (ref 4.0–10.5)
nRBC: 0 % (ref 0.0–0.2)

## 2022-03-28 LAB — URINALYSIS, ROUTINE W REFLEX MICROSCOPIC
Bilirubin Urine: NEGATIVE
Glucose, UA: NEGATIVE mg/dL
Hgb urine dipstick: NEGATIVE
Ketones, ur: NEGATIVE mg/dL
Leukocytes,Ua: NEGATIVE
Nitrite: NEGATIVE
Protein, ur: NEGATIVE mg/dL
Specific Gravity, Urine: 1.014 (ref 1.005–1.030)
pH: 5 (ref 5.0–8.0)

## 2022-03-28 LAB — LIPASE, BLOOD: Lipase: 29 U/L (ref 11–51)

## 2022-03-28 MED ORDER — KETOROLAC TROMETHAMINE 15 MG/ML IJ SOLN
15.0000 mg | Freq: Once | INTRAMUSCULAR | Status: AC
  Administered 2022-03-28: 15 mg via INTRAVENOUS
  Filled 2022-03-28: qty 1

## 2022-03-28 MED ORDER — IOHEXOL 300 MG/ML  SOLN
100.0000 mL | Freq: Once | INTRAMUSCULAR | Status: AC | PRN
  Administered 2022-03-28: 100 mL via INTRAVENOUS

## 2022-03-28 MED ORDER — OXYCODONE-ACETAMINOPHEN 5-325 MG PO TABS: 1.0000 | ORAL_TABLET | Freq: Four times a day (QID) | ORAL | 0 refills | Status: AC | PRN

## 2022-03-28 MED ORDER — ONDANSETRON HCL 4 MG/2ML IJ SOLN
4.0000 mg | Freq: Once | INTRAMUSCULAR | Status: AC
  Administered 2022-03-28: 4 mg via INTRAVENOUS
  Filled 2022-03-28: qty 2

## 2022-03-28 MED ORDER — SODIUM CHLORIDE 0.9 % IV BOLUS
1000.0000 mL | Freq: Once | INTRAVENOUS | Status: AC
  Administered 2022-03-28: 1000 mL via INTRAVENOUS

## 2022-03-28 MED ORDER — FENTANYL CITRATE PF 50 MCG/ML IJ SOSY
50.0000 ug | PREFILLED_SYRINGE | Freq: Once | INTRAMUSCULAR | Status: AC
  Administered 2022-03-28: 50 ug via INTRAVENOUS
  Filled 2022-03-28: qty 1

## 2022-03-28 NOTE — ED Triage Notes (Signed)
Patient presents with right sided abdominal pain since last night

## 2022-03-28 NOTE — Discharge Instructions (Addendum)
You were seen in the emergency department for right lower abdominal pain.  You had a CT scan that showed inflammation of your right ureter however you did not have any findings concerning for urinary tract infection on your urine.  You also have multiple lymph nodes noted in your CT scan that could be secondary to your CLL however it is importantly follow-up with your primary care physician and your oncologist for further evaluation.  Pain control:  Ibuprofen (motrin/aleve/advil) - You can take 3-4 tablets (600-800 mg) every 6 hours as needed for pain/fever.  Acetaminophen (tylenol) - You can take 2 extra strength tablets (1000 mg) every 6 hours as needed for pain/fever.  You can alternate these medications or take them together.  Make sure you eat food/drink water when taking these medications.  If you are still having severe pain you can take 1 Percocet.  This is a narcotic pain medication.  It can cause constipation, if you develop constipation start a stool softener and MiraLAX.  This is a very addictive medication.  Keep out of reach of children.  Your urine was sent for culture.  Return to the emergency department for any ongoing or worsening symptoms.

## 2022-03-28 NOTE — ED Provider Triage Note (Signed)
  Emergency Medicine Provider Triage Evaluation Note  Philip Tran , a 66 y.o.male,  was evaluated in triage.  Pt complains of right-sided abdominal pain.  Patient states that started yesterday has progressively worsened.  Denies any urinary symptoms at this time.  He does state that he has a history of diverticulitis.  Reports some nausea, but denies any other symptoms at this time.   Review of Systems  Positive: Right-sided abdominal pain, nausea Negative: Denies fever, chest pain, vomiting  Physical Exam   Vitals:   03/28/22 1210  BP: (!) 144/107  Pulse: (!) 54  Resp: 18  Temp: 97.9 F (36.6 C)  SpO2: 94%   Gen:   Awake, no distress   Resp:  Normal effort  MSK:   Moves extremities without difficulty  Other:    Medical Decision Making  Given the patient's initial medical screening exam, the following diagnostic evaluation has been ordered. The patient will be placed in the appropriate treatment space, once one is available, to complete the evaluation and treatment. I have discussed the plan of care with the patient and I have advised the patient that an ED physician or mid-level practitioner will reevaluate their condition after the test results have been received, as the results may give them additional insight into the type of treatment they may need.    Diagnostics: Labs, UA, abdominal CT  Treatments: none immediately   Teodoro Spray, [place] 03/28/22 1214

## 2022-03-28 NOTE — ED Provider Notes (Signed)
[facility] Provider Note    Event Date/Time   First MD Initiated Contact with Patient 03/28/22 1413     (approximate)   History   Abdominal Pain   HPI  Philip Tran is a 66 y.o. male with past medical history significant for CLL, DM, who presents to the emergency department with abdominal pain.  Patient states that he woke up this morning and developed severe right lower quadrant abdominal pain.  Sharp stabbing pain that he currently rates as a 10/10.  Took 800 mg of ibuprofen with no improvement of his pain.  Endorses nausea but no episodes of vomiting.  Endorses dysuria and feeling like he needs to urinate.  No prior history of urinary tract infection.  Prior abdominal surgeries with hernia repairs in the past.  States that the pain does not move down to his testicles.      Physical Exam   Triage Vital Signs: ED Triage Vitals  Enc Vitals Group     BP 03/28/22 1210 (!) 144/107     Pulse Rate 03/28/22 1210 (!) 54     Resp 03/28/22 1210 18     Temp 03/28/22 1210 97.9 F (36.6 C)     Temp Source 03/28/22 1210 Oral     SpO2 03/28/22 1210 94 %     Weight 03/28/22 1215 172 lb (78 kg)     Height 03/28/22 1215 '5\' 7"'$  (1.702 m)     Head Circumference --      Peak Flow --      Pain Score 03/28/22 1215 8     Pain Loc --      Pain Edu? --      Excl. in [place]? --     Most recent vital signs: Vitals:   03/28/22 1210 03/28/22 1713  BP: (!) 144/107 135/78  Pulse: (!) 54 62  Resp: 18 18  Temp: 97.9 F (36.6 C) 98 F (36.7 C)  SpO2: 94% 96%    Physical Exam Constitutional:      Appearance: He is well-developed.  HENT:     Head: Atraumatic.  Eyes:     Conjunctiva/sclera: Conjunctivae normal.  Cardiovascular:     Rate and Rhythm: Regular rhythm.     Heart sounds: Normal heart sounds.  Pulmonary:     Effort: No respiratory distress.  Abdominal:     Tenderness: There is abdominal tenderness (Area of focal tenderness to the right lower abdomen.)  in the right lower quadrant. There is no right CVA tenderness or left CVA tenderness. Negative signs include Murphy's sign, Rovsing's sign, McBurney's sign and psoas sign.  Musculoskeletal:        General: Normal range of motion.     Cervical back: Normal range of motion.  Skin:    General: Skin is warm.  Neurological:     General: No focal deficit present.     Mental Status: He is alert. Mental status is at baseline.  Psychiatric:        Mood and Affect: Mood normal.          IMPRESSION / MDM / [place] / ED COURSE  I reviewed the triage vital signs and the nursing notes.  66 year old male with past medical history significant for diabetes, CLL, presents to the emergency department with 1 day of abdominal pain.  On arrival afebrile, hemodynamically stable.  Differential diagnosis including kidney stone, pyelonephritis, urinary tract infection, appendicitis, bowel obstruction.  Patient with no radiation into the  testicles, no concern for epididymitis or testicular torsion.  Clinical picture is not consistent with a dissection.  Lab work with leukocytosis which is consistent with his chronically elevated white count from his CLL.  No significant anemia.  Creatinine is at his baseline.  Potassium mildly elevated at 5.3.  No other significant electrolyte abnormalities.   EKG  RADIOLOGY [facility] interpretation of imaging: CT scan abdomen and pelvis with contrast -no signs of hydronephrosis.  No signs of acute appendicitis.  CT scan was read as multiple lymph nodes concerning for possible malignancy although it is most likely secondary to his CLL.  Also noted inflammation and some mild hydronephrosis of the right ureter but no obstructive kidney stones.]  Patient was given 1 L of IV fluids, IV ketorolac, IV Zofran  On reevaluation had improvement of his pain but ongoing symptoms.  Given a dose of IV fentanyl.  Uncertain of what is causing the patient's abdominal pain.  He is  focally tender over the area of inflammation that they noted as a thickened ureter on CT scan however he does not have any findings concerning for urinary tract infection.  Urine culture was added on.  We will hold on any antibiotics at this time.  He does not otherwise have a fever and no longer is having any dysuria, urinary urgency or frequency.  Tolerating p.o.  States that he is feeling better.  Discussed close follow-up with his primary care physician.  We will do a short course of narcotic pain medication.  Discussed the risk and benefits of this medication.  Discussed return precautions for any ongoing symptoms or worsening symptoms.  Discussed close follow-up.  No questions at time of discharge.   ED Results / Procedures / Treatments   Labs (all labs ordered are listed, but only abnormal results are displayed) Labs interpreted as -   No significant leukocytosis.  Creatinine at baseline.  Elevated potassium level.  No signs of UTI Labs Reviewed  COMPREHENSIVE METABOLIC PANEL - Abnormal; Notable for the following components:      Result Value   Potassium 5.3 (*)    Glucose, Bld 125 (*)    ALT 46 (*)    Anion gap 4 (*)    All other components within normal limits  CBC WITH DIFFERENTIAL/PLATELET - Abnormal; Notable for the following components:   WBC 15.0 (*)    Lymphs Abs 8.1 (*)    Monocytes Absolute 1.4 (*)    All other components within normal limits  URINALYSIS, ROUTINE W REFLEX MICROSCOPIC - Abnormal; Notable for the following components:   Color, Urine YELLOW (*)    APPearance CLEAR (*)    All other components within normal limits  URINE CULTURE  LIPASE, BLOOD  PATHOLOGIST SMEAR REVIEW        PROCEDURES:  Critical Care performed: No  Procedures  Patient's presentation is most consistent with acute presentation with potential threat to life or bodily function.   MEDICATIONS ORDERED IN ED: Medications  iohexol (OMNIPAQUE) 300 MG/ML solution 100 mL (100 mLs  Intravenous Contrast Given 03/28/22 1327)  sodium chloride 0.9 % bolus 1,000 mL (0 mLs Intravenous Stopped 03/28/22 1721)  ketorolac (TORADOL) 15 MG/ML injection 15 mg (15 mg Intravenous Given 03/28/22 1551)  ondansetron (ZOFRAN) injection 4 mg (4 mg Intravenous Given 03/28/22 1550)  fentaNYL (SUBLIMAZE) injection 50 mcg (50 mcg Intravenous Given 03/28/22 1712)    FINAL CLINICAL IMPRESSION(S) / ED DIAGNOSES   Final diagnoses:  Right lower quadrant abdominal pain  Abdominal  lymphadenopathy     Rx / DC Orders   ED Discharge Orders          Ordered    oxyCODONE-acetaminophen (PERCOCET) 5-325 MG tablet  Every 6 hours PRN        03/28/22 1707             Note:  This document was prepared using Dragon voice recognition software and may include unintentional dictation errors.   Nathaniel Man, MD 03/28/22 1725

## 2022-03-29 ENCOUNTER — Telehealth: Payer: Self-pay | Admitting: Physician Assistant

## 2022-03-29 ENCOUNTER — Ambulatory Visit (INDEPENDENT_AMBULATORY_CARE_PROVIDER_SITE_OTHER): Payer: 59 | Admitting: Family Medicine

## 2022-03-29 ENCOUNTER — Encounter: Payer: Self-pay | Admitting: Family Medicine

## 2022-03-29 VITALS — BP 110/60 | HR 63 | Temp 98.6°F | Ht 65.75 in | Wt 176.4 lb

## 2022-03-29 DIAGNOSIS — C9110 Chronic lymphocytic leukemia of B-cell type not having achieved remission: Secondary | ICD-10-CM

## 2022-03-29 DIAGNOSIS — I880 Nonspecific mesenteric lymphadenitis: Secondary | ICD-10-CM | POA: Diagnosis not present

## 2022-03-29 DIAGNOSIS — R599 Enlarged lymph nodes, unspecified: Secondary | ICD-10-CM | POA: Diagnosis not present

## 2022-03-29 LAB — PATHOLOGIST SMEAR REVIEW

## 2022-03-29 MED ORDER — METRONIDAZOLE 500 MG PO TABS: 500.0000 mg | ORAL_TABLET | Freq: Three times a day (TID) | ORAL | 0 refills | Status: DC

## 2022-03-29 MED ORDER — CIPROFLOXACIN HCL 750 MG PO TABS: 750.0000 mg | ORAL_TABLET | Freq: Two times a day (BID) | ORAL | 0 refills | Status: DC

## 2022-03-29 NOTE — Telephone Encounter (Signed)
Please schedule an appointment for today.

## 2022-03-29 NOTE — Progress Notes (Signed)
Zanaya Baize T. Flavius Repsher, MD, [place] at [facility] [place], [id number]  Phone: [phone]  FAX: [place] - 66 y.o. male  MRN [id number]  Date of Birth: 17-Jun-1955  Date: 03/29/2022  PCP: Owens Loffler, MD  Referral: Owens Loffler, MD  Chief Complaint  Patient presents with   Right Side Pain    Seen in ER 03/28/22   Subjective:   Philip Tran is a 66 y.o. very pleasant male patient with Body mass index is 28.68 kg/m. who presents with the following:  Pleasant gentleman who I remember well over the years, and he presents today with some acute abdominal pain that started on Friday.  He continued to escalate over the weekend, he went to the emergency room yesterday.  He had a fairly comprehensive work-up done including multiple labs and a CT of the abdomen and pelvis.  CT was significant for multiple mesenteric as well as retroperitoneal enlarged lymph nodes.  He also did have some mild hydronephrosis and some mild thickening of the ureteral wall as well as the periureteral inflammation and bladder thickening.  Urinalysis was normal.  Urine culture is pending.  White blood cell count remains chronically elevated at 15,000 without any evidence of abnormally elevated neutrophils.  Historically, his lymphocytes has been quite high with his CLL.  He has been lost oncological follow-up, and he has declined other referrals in the past.  He continues to have sharp abdominal pain.  They did give him some narcotics in the emergency room.  He is eating and drinking, but his last bowel movement was yesterday.  Everything started Saturday morning - Friday night.  Pain will fluctuating, and adjusting position will help.   CT Abdomen Pelvis W Contrast  Result Date: 03/28/2022 CLINICAL DATA:  66 year old male with RIGHT abdominal and pelvic pain. EXAM: CT ABDOMEN AND PELVIS WITH CONTRAST TECHNIQUE:  Multidetector CT imaging of the abdomen and pelvis was performed using the standard protocol following bolus administration of intravenous contrast. RADIATION DOSE REDUCTION: This exam was performed according to the departmental dose-optimization program which includes automated exposure control, adjustment of the mA and/or kV according to patient size and/or use of iterative reconstruction technique. CONTRAST:  110m OMNIPAQUE IOHEXOL 300 MG/ML  SOLN COMPARISON:  02/04/2010 CT FINDINGS: Lower chest: No acute abnormality. Hepatobiliary: No significant liver or gallbladder abnormalities identified. There is no evidence of intrahepatic or extrahepatic biliary dilatation. Pancreas: Unremarkable Spleen: Unremarkable Adrenals/Urinary Tract: There is mild RIGHT ureteral wall thickening, RIGHT periureteral inflammation, mild bladder wall thickening and perivesical inflammation likely represents UTI/cystitis. No definite renal abnormalities are noted although mild motion artifact slightly limits sensitivity. The adrenal glands are unremarkable. Stomach/Bowel: Stomach is within normal limits. No evidence of bowel wall thickening, distention, or inflammatory changes. Vascular/Lymphatic: Aortic atherosclerotic calcification noted without aneurysm. There are multiple mildly enlarged mesenteric and retroperitoneal lymph nodes with an index mesenteric node measuring 1 cm (image 55: Series 2). Reproductive: Prostate is unremarkable. Other: No ascites, focal collection or pneumoperitoneum. Musculoskeletal: No acute or suspicious bony abnormalities are noted. Degenerative disc disease at L2-3 and L4-5 noted. RIGHT hip arthroplasty changes are present. IMPRESSION: 1. Mild RIGHT ureteral wall thickening, RIGHT periureteral inflammation, mild bladder wall thickening and perivesical inflammation likely represents UTI/cystitis. 2. Multiple mildly enlarged mesenteric and retroperitoneal lymph nodes, nonspecific but may be reactive.  Consider sampling or PET CT if there is clinical suspicion for LEFT lymphoproliferative disorder. Otherwise CT follow-up in  3-6 months is recommended. 3.  Aortic Atherosclerosis (ICD10-I70.0). Electronically Signed   By: Margarette Canada M.D.   On: 03/28/2022 14:02     Review of Systems is noted in the HPI, as appropriate  Patient Active Problem List   Diagnosis Date Noted   CLL (chronic lymphocytic leukemia) ([place]) 03/03/2011    Priority: High   Chronic hepatitis C virus infection ([place]) 12/10/2009    Priority: High   History of total right hip arthroplasty 02/01/2014   Primary osteoarthritis of left hip 02/01/2014   Rheumatoid arthritis(714.0) 03/05/2011    Past Medical History:  Diagnosis Date   BCC (basal cell carcinoma) 02/09/2022   right cheek, Mohs   CLL (chronic lymphoblastic leukemia) 01/12/2010   Dr. 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   Rheumatoid arthritis ([place])     Past Surgical History:  Procedure Laterality Date   [place] ARTHROPLASTY Right 2014   [facility]    History reviewed. No pertinent family history.  Social History   Social History Narrative   Avid Associate Professor, golfer, Engineer, maintenance (IT).   History of remote IV drug use, clean for many years now.     Objective:   BP 110/60   Pulse 63   Temp 98.6 F (37 C) (Oral)   Ht 5' 5.75" (1.67 m)   Wt 176 lb 6 oz (80 kg)   SpO2 94%   BMI 28.68 kg/m   GEN: No acute distress; alert,appropriate. PULM: Breathing comfortably in no respiratory distress PSYCH: Normally interactive.  CV: RRR, no m/g/r  PULM: Normal respiratory rate, no accessory muscle use. No wheezes, crackles or rhonchi  ABD: S, mild nonfocal tenderness, worse in the right lower quadrant, ND, + BS, No rebound, No HSM   Laboratory and Imaging  Data: Results for orders placed or performed during the hospital encounter of 03/28/22  Comprehensive metabolic panel  Result Value Ref Range   Sodium 140 135 - 145 mmol/L   Potassium 5.3 (H) 3.5 - 5.1 mmol/L   Chloride 107 98 - 111 mmol/L   CO2 29 22 - 32 mmol/L   Glucose, Bld 125 (H) 70 - 99 mg/dL   BUN 19 8 - 23 mg/dL   Creatinine, Ser 1.24 0.61 - 1.24 mg/dL   Calcium 9.0 8.9 - 10.3 mg/dL   Total Protein 6.8 6.5 - 8.1 g/dL   Albumin 4.1 3.5 - 5.0 g/dL   AST 31 15 - 41 U/L   ALT 46 (H) 0 - 44 U/L   Alkaline Phosphatase 87 38 - 126 U/L   Total Bilirubin 0.5 0.3 - 1.2 mg/dL   GFR, Estimated >60 >60 mL/min   Anion gap 4 (L) 5 - 15  CBC with Differential  Result Value Ref Range   WBC 15.0 (H) 4.0 - 10.5 K/uL   RBC 5.72 4.22 - 5.81 MIL/uL   Hemoglobin 15.4 13.0 - 17.0 g/dL   HCT 48.4 39.0 - 52.0 %   MCV 84.6 80.0 - 100.0 fL   MCH 26.9 26.0 - 34.0 pg   MCHC 31.8 30.0 - 36.0 g/dL   RDW 15.1 11.5 - 15.5 %   Platelets 263 150 - 400 K/uL   nRBC 0.0 0.0 - 0.2 %   Neutrophils Relative % 34 %  Neutro Abs 5.1 1.7 - 7.7 K/uL   Lymphocytes Relative 53 %   Lymphs Abs 8.1 (H) 0.7 - 4.0 K/uL   Monocytes Relative 9 %   Monocytes Absolute 1.4 (H) 0.1 - 1.0 K/uL   Eosinophils Relative 3 %   Eosinophils Absolute 0.4 0.0 - 0.5 K/uL   Basophils Relative 1 %   Basophils Absolute 0.1 0.0 - 0.1 K/uL   RBC Morphology MORPHOLOGY UNREMARKABLE    Smear Review Normal platelet morphology    Immature Granulocytes 0 %   Abs Immature Granulocytes 0.03 0.00 - 0.07 K/uL   Reactive, Benign Lymphocytes PRESENT   Lipase, blood  Result Value Ref Range   Lipase 29 11 - 51 U/L  Urinalysis, Routine w reflex microscopic Urine, Clean Catch  Result Value Ref Range   Color, Urine YELLOW (A) YELLOW   APPearance CLEAR (A) CLEAR   Specific Gravity, Urine 1.014 1.005 - 1.030   pH 5.0 5.0 - 8.0   Glucose, UA NEGATIVE NEGATIVE mg/dL   Hgb urine dipstick NEGATIVE NEGATIVE   Bilirubin Urine NEGATIVE NEGATIVE    Ketones, ur NEGATIVE NEGATIVE mg/dL   Protein, ur NEGATIVE NEGATIVE mg/dL   Nitrite NEGATIVE NEGATIVE   Leukocytes,Ua NEGATIVE NEGATIVE     Assessment and Plan:     ICD-10-CM   1. Mesenteric adenitis  I88.0 Ambulatory referral to Hematology / Oncology    2. Enlarged lymph nodes  R59.9 Ambulatory referral to Hematology / Oncology    3. CLL (chronic lymphocytic leukemia) (HCC)  C91.10 Ambulatory referral to Hematology / Oncology     Total encounter time: 40 minutes. This includes total time spent on the day of encounter.  Additional time spent on extensive chart review including review of multiple labs going back years, as well as prior procedural and consultative notes.  Enlarged mesenteric lymph nodes in the setting of abdominal pain, worse in the right lower quadrant without evidence of appendicitis or other acute intra-abdominal process.  This would seem to follow the mesenteric adenitis would be the most likely cause.  History of diverticulosis, think it is reasonable to cover him with metronidazole for diverticulitis and/or colitis not seen on CT.  With the bladder wall thickening, he will also be covered with Cipro, which would also be coverage for diverticulitis.  Think the bigger question is the mesenteric as well as retroperitoneal lymphadenopathy in the setting of known CLL.  He has been not entirely receptive to follow-ups or other routine screenings in the past, but I insisted today with the patient and his wife that he follow-up and see oncology for further evaluation.  I would like to hold off any other advanced imaging until it is under their direction.  We will follow additional labs as they become available.  Medication Management during today's office visit: Meds ordered this encounter  Medications   metroNIDAZOLE (FLAGYL) 500 MG tablet    Sig: Take 1 tablet (500 mg total) by mouth 3 (three) times daily.    Dispense:  30 tablet    Refill:  0   ciprofloxacin (CIPRO) 750  MG tablet    Sig: Take 1 tablet (750 mg total) by mouth 2 (two) times daily.    Dispense:  20 tablet    Refill:  0   There are no discontinued medications.  Orders placed today for conditions managed today: Orders Placed This Encounter  Procedures   Ambulatory referral to Hematology / Oncology    Disposition: No follow-ups on file.  Systems analyst  One speech-to-text software was used for transcription in this dictation.  Possible transcriptional errors can occur using Editor, commissioning.   Signed,  Maud Deed. Devita Nies, MD   Outpatient Encounter Medications as of 03/29/2022  Medication Sig   ciprofloxacin (CIPRO) 750 MG tablet Take 1 tablet (750 mg total) by mouth 2 (two) times daily.   hydrocortisone (ANUSOL-HC) 2.5 % rectal cream PLACE 1 APPLICATION RECTALLY TWICE DAILY   metroNIDAZOLE (FLAGYL) 500 MG tablet Take 1 tablet (500 mg total) by mouth 3 (three) times daily.   mupirocin ointment (BACTROBAN) 2 % Apply 1 Application topically daily.   oxyCODONE-acetaminophen (PERCOCET) 5-325 MG tablet Take 1 tablet by mouth every 6 (six) hours as needed for up to 3 days for severe pain.   zolpidem (AMBIEN) 10 MG tablet TAKE 1 TABLET BY MOUTH EVERY NIGHT AT BEDTIME AS NEEDED   No facility-administered encounter medications on file as of 03/29/2022.

## 2022-03-29 NOTE — Telephone Encounter (Signed)
Scheduled appt per 10/16 referral. Pt is aware of appt date and time. Pt is aware to arrive 15 mins prior to appt time and to bring and updated insurance card. Pt is aware of appt location.   

## 2022-03-30 LAB — URINE CULTURE: Culture: <10000 — AB

## 2022-03-31 ENCOUNTER — Ambulatory Visit: Payer: 59 | Admitting: Family Medicine

## 2022-04-01 NOTE — Telephone Encounter (Signed)
Can you help him get a work note that says that he can return to work on Friday.

## 2022-04-06 MED ORDER — AMOXICILLIN-POT CLAVULANATE 875-125 MG PO TABS: 1.0000 | ORAL_TABLET | Freq: Two times a day (BID) | ORAL | 0 refills | Status: DC

## 2022-04-06 NOTE — Progress Notes (Unsigned)
[facility] [phone]   [phone]  INITIAL CONSULTATION:  Patient Care Team: Owens Loffler, MD as PCP - General  CHIEF COMPLAINTS/PURPOSE OF CONSULTATION:  Abdominal Lymphadenopathy Chronic Lymphocytic Leukemia  HISTORY OF PRESENTING ILLNESS:  Philip Tran 66 y.o. male with medical history significant for CLL Rai Stage 0, rheumatoid arthritis, history of hepatitis B and C infection. He presents to the diagnostic clinic due to recent CT imaging concerning for new lymphadenopathy. He is accompanied by his wife for this visit.   On review of the previous records, Philip Tran presented with right sided abdominal and pelvic pain.  CT scanning of the abdomen pelvis was obtained on 03/28/2022.  Findings revealed mild right ureteral wall thickening, right periureteral inflammation, mild bladder wall thickening and perivesical inflammation likely representing UTI/cystitis.  Multiple mild enlarged mesenteric and retroperitoneal lymph nodes, nonspecific but may be reactive. He followed up with his PCP, Dr. Frederico Hamman Copland, on 03/29/2022 and was prescribed ciprofloxacin and flagyl. Due to antibiotic induced diarrhea, cirpo/flagyl were discontinued and he was started on Augmentin on 04/06/2022.   On exam today, Philip Tran reports that although he developed diarrhea with cipro/flagyl, his abdominal pain resolved within two days. He denies any subsequent episodes of abdominal pain and diarrhea is slowly improving. He is feeling well otherwise without any new fatigue and appetite changes. He works full time as a Administrator. He denies any fevers, chills, sweats, shortness of breath, chest pain, cough, nausea or vomiting. He has no other complaints. Rest of the 10 point ROS is below.   MEDICAL HISTORY:  Past Medical History:  Diagnosis Date   BCC (basal cell carcinoma) 02/09/2022   right cheek, Mohs   CLL (chronic lymphoblastic leukemia)  01/12/2010   Dr. 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   Rheumatoid arthritis ([place])     SURGICAL HISTORY: Past Surgical History:  Procedure Laterality Date   HERNIA REPAIR     TOTAL HIP ARTHROPLASTY Right 2014   [facility]    SOCIAL HISTORY: Social History   Socioeconomic History   Marital status: Married    Spouse name: Not on file   Number of children: Not on file   Years of education: Not on file   Highest education level: Not on file  Occupational History   Not on file  Tobacco Use   Smoking status: Former    Packs/day: 2.00    Years: 20.00    Total pack years: 40.00    Types: Cigarettes    Quit date: 07/07/2006    Years since quitting: 15.7   Smokeless tobacco: Never  Vaping Use   Vaping Use: Never used  Substance and Sexual Activity   Alcohol use: Not Currently    Comment: 3 beers a week   Drug use: No   Sexual activity: Not on file  Other Topics Concern   Not on file  Social History Narrative   Avid basketball player, golfer, softball player.   History of remote IV drug use, clean for many years now.   Social Determinants of Health   Financial Resource Strain: Not on file  Food Insecurity: Not on file  Transportation Needs: Not on file  Physical Activity: Not on file  Stress: Not on file  Social Connections:  Not on file  Intimate Partner Violence: Not on file    FAMILY HISTORY: No family history on file.  ALLERGIES:  is allergic to codeine.  MEDICATIONS:  Current Outpatient Medications  Medication Sig Dispense Refill   amoxicillin-clavulanate (AUGMENTIN) 875-125 MG tablet Take 1 tablet by mouth 2 (two) times daily. 14 tablet 0   hydrocortisone (ANUSOL-HC) 2.5 % rectal cream PLACE 1 APPLICATION RECTALLY TWICE DAILY 30 g 2   zolpidem (AMBIEN) 10 MG tablet TAKE 1 TABLET BY MOUTH  EVERY NIGHT AT BEDTIME AS NEEDED 30 tablet 5   mupirocin ointment (BACTROBAN) 2 % Apply 1 Application topically daily. (Patient not taking: Reported on 04/07/2022) 22 g 0   No current facility-administered medications for this visit.    REVIEW OF SYSTEMS:   Constitutional: ( - ) fevers, ( - )  chills , ( - ) night sweats Eyes: ( - ) blurriness of vision, ( - ) double vision, ( - ) watery eyes Ears, nose, mouth, throat, and face: ( - ) mucositis, ( - ) sore throat Respiratory: ( - ) cough, ( - ) dyspnea, ( - ) wheezes Cardiovascular: ( - ) palpitation, ( - ) chest discomfort, ( - ) lower extremity swelling Gastrointestinal:  ( - ) nausea, ( - ) heartburn, ( - ) change in bowel habits Skin: ( - ) abnormal skin rashes Lymphatics: ( - ) new lymphadenopathy, ( - ) easy bruising Neurological: ( - ) numbness, ( - ) tingling, ( - ) new weaknesses Behavioral/Psych: ( - ) mood change, ( - ) new changes  All other systems were reviewed with the patient and are negative.  PHYSICAL EXAMINATION: ECOG PERFORMANCE STATUS: 1 - Symptomatic but completely ambulatory  Vitals:   04/07/22 1446  BP: 132/86  Pulse: 71  Resp: 17  Temp: 98.6 F (37 C)  SpO2: 97%   Filed Weights   04/07/22 1446  Weight: 177 lb 1.6 oz (80.3 kg)    GENERAL: well appearing male in NAD  SKIN: skin color, texture, turgor are normal, no rashes or significant lesions EYES: conjunctiva are pink and non-injected, sclera clear OROPHARYNX: no exudate, no erythema; lips, buccal mucosa, and tongue normal  NECK: supple, non-tender LYMPH:  no palpable lymphadenopathy in the cervical, axillary or supraclavicular lymph nodes.  LUNGS: clear to auscultation and percussion with normal breathing effort HEART: regular rate & rhythm and no murmurs and no lower extremity edema ABDOMEN: soft, non-tender, non-distended, normal bowel sounds Musculoskeletal: no cyanosis of digits and no clubbing  PSYCH: alert & oriented x 3, fluent  speech NEURO: no focal motor/sensory deficits  LABORATORY DATA:  I have reviewed the data as listed    Latest Ref Rng & Units 04/07/2022    3:41 PM 03/28/2022   12:21 PM 02/18/2022    3:39 PM  CBC  WBC 4.0 - 10.5 K/uL 18.3  15.0  16.6   Hemoglobin 13.0 - 17.0 g/dL 14.2  15.4  14.9   Hematocrit 39.0 - 52.0 % 42.9  48.4  45.3   Platelets 150 - 400 K/uL 294  263  259.0        Latest Ref Rng & Units 04/07/2022    3:41 PM 03/28/2022   12:21 PM 02/18/2022    3:39 PM  CMP  Glucose 70 - 99 mg/dL 96  125  88   BUN 8 - 23 mg/dL 16  19  19    Creatinine 0.61 - 1.24 mg/dL 1.07  1.24  1.10  Sodium 135 - 145 mmol/L 141  140  144   Potassium 3.5 - 5.1 mmol/L 4.6  5.3  4.8   Chloride 98 - 111 mmol/L 106  107  106   CO2 22 - 32 mmol/L 27  29  29    Calcium 8.9 - 10.3 mg/dL 9.0  9.0  9.9   Total Protein 6.5 - 8.1 g/dL 6.5  6.8  6.9   Total Bilirubin 0.3 - 1.2 mg/dL <0.1  0.5  0.4   Alkaline Phos 38 - 126 U/L 71  87  90   AST 15 - 41 U/L 44  31  34   ALT 0 - 44 U/L 71  46  44      RADIOGRAPHIC STUDIES: I have personally reviewed the radiological images as listed and agreed with the findings in the report. CT Abdomen Pelvis W Contrast  Result Date: 03/28/2022 CLINICAL DATA:  66 year old male with RIGHT abdominal and pelvic pain. EXAM: CT ABDOMEN AND PELVIS WITH CONTRAST TECHNIQUE: Multidetector CT imaging of the abdomen and pelvis was performed using the standard protocol following bolus administration of intravenous contrast. RADIATION DOSE REDUCTION: This exam was performed according to the departmental dose-optimization program which includes automated exposure control, adjustment of the mA and/or kV according to patient size and/or use of iterative reconstruction technique. CONTRAST:  158m OMNIPAQUE IOHEXOL 300 MG/ML  SOLN COMPARISON:  02/04/2010 CT FINDINGS: Lower chest: No acute abnormality. Hepatobiliary: No significant liver or gallbladder abnormalities identified. There is no evidence of  intrahepatic or extrahepatic biliary dilatation. Pancreas: Unremarkable Spleen: Unremarkable Adrenals/Urinary Tract: There is mild RIGHT ureteral wall thickening, RIGHT periureteral inflammation, mild bladder wall thickening and perivesical inflammation likely represents UTI/cystitis. No definite renal abnormalities are noted although mild motion artifact slightly limits sensitivity. The adrenal glands are unremarkable. Stomach/Bowel: Stomach is within normal limits. No evidence of bowel wall thickening, distention, or inflammatory changes. Vascular/Lymphatic: Aortic atherosclerotic calcification noted without aneurysm. There are multiple mildly enlarged mesenteric and retroperitoneal lymph nodes with an index mesenteric node measuring 1 cm (image 55: Series 2). Reproductive: Prostate is unremarkable. Other: No ascites, focal collection or pneumoperitoneum. Musculoskeletal: No acute or suspicious bony abnormalities are noted. Degenerative disc disease at L2-3 and L4-5 noted. RIGHT hip arthroplasty changes are present. IMPRESSION: 1. Mild RIGHT ureteral wall thickening, RIGHT periureteral inflammation, mild bladder wall thickening and perivesical inflammation likely represents UTI/cystitis. 2. Multiple mildly enlarged mesenteric and retroperitoneal lymph nodes, nonspecific but may be reactive. Consider sampling or PET CT if there is clinical suspicion for LEFT lymphoproliferative disorder. Otherwise CT follow-up in 3-6 months is recommended. 3.  Aortic Atherosclerosis (ICD10-I70.0). Electronically Signed   By: JMargarette CanadaM.D.   On: 03/28/2022 14:02    ASSESSMENT & PLAN TGEOFF DACANAYis a 66y.o. male who presents to the clinic for recent CT imaging that showed abdominal lymphadenopathy. We reviewed the CT scan results/images in detail that showed mildly enlarged lymph nodes, measuring up to 1 cm in size. Patient is no longer experiencing abdominal pain after starting antibiotic therapy. Likely cause of enlarged  lymph nodes in infectious/inflammatory process. We reviewed below criteria to start treatment for CLL including cytopenias, B symptoms and lymphadenopathy. At this time patient does not meet criteria and recommendation is to continue with watching waiting. Since pain has resolved, we can monitor and consider repeat imaging in 3-6 months.    #Abdominal Pain and Lymphadenopathy: --Suspect infectious/inflammatory process.  --Pain resolved with start of antibiotic therapy. Recommend to complete course of  Augmentin prescribed by PCP --Labs today to check ESR/CRP levels --Consider repeat CT imaging with PCP in 3-6 months to monitor enlarged lymph nodes.   #Diarrhea: --Secondary to previous cipro/flagyl.  --Improved since switching to Augmentin --Monitor closely and f/u with PCP with symptoms don't improve or resolve.  --Labs to check electrolytes, magnesium and renal function.   #CLL, Stage Rai 0: --Diagnosed in July 2011 and most recently managed by [facility]. He was last evaluated in April 2017. --Prognostic panel including FISH panel showed three chromosome 12 signals in 34% of cells examined.  --Patient denies any B symptoms --Labs from 03/28/2022 show stable leukocytosis and absolute lymphocyte count, WBC 15 and ALC 8.1. --Repeat labs today to check CBC and CMP --Recommend to continue to monitor.  --Tentative plan to see patient back in 1 year with labs.   Plan was discussed with Dr. Narda Rutherford, hematologist, who was in agreement with the above recommendations.   Orders Placed This Encounter  Procedures   CBC with Differential ([place] Only)    Standing Status:   Future    Number of Occurrences:   1    Standing Expiration Date:   04/07/2023   CMP ([place] only)    Standing Status:   Future    Number of Occurrences:   1    Standing Expiration Date:   04/07/2023   Sedimentation rate    Standing Status:   Future    Number of Occurrences:   1    Standing Expiration Date:    04/06/2023   C-reactive protein    Standing Status:   Future    Number of Occurrences:   1    Standing Expiration Date:   04/06/2023   Magnesium    Standing Status:   Future    Number of Occurrences:   1    Standing Expiration Date:   04/07/2023    All questions were answered. The patient knows to call the clinic with any problems, questions or concerns.  I have spent a total of 60 minutes minutes of face-to-face and non-face-to-face time, preparing to see the patient, obtaining and/or reviewing separately obtained history, performing a medically appropriate examination, counseling and educating the patient, ordering tests/procedures, documenting clinical information in the electronic health record, and care coordination.   Dede Query, PA-C Department of Hematology/Oncology [facility] at [facility] Phone: [phone]  Resources:  Marthenia Rolling, Augustin Schooling BD, Aragon D, Caligaris-Cappio F, Dighiero G, Dhner H, [place], Ke[place] M, [place] E, Chiorazzi N, [place], Rai KR, [place], Eichhorst B, O'Brien S, Robak T, Seymour JF, [place] TJ. iwCLL guidelines for diagnosis, indications for treatment, response assessment, and supportive management of CLL. Blood. 2018 Jun 21;131(25):2745-2760.  Active disease should be clearly documented to initiate therapy. At least 1 of the following criteria should be met.  1) Evidence of progressive marrow failure as manifested by the development of, or worsening of, anemia and/or thrombocytopenia. Cutoff levels of Hb <10 g/dL or platelet counts <100  109/L are generally regarded as indication for treatment. However, in some patients, platelet counts <100  109/L may remain stable over a long period; this situation does not automatically require therapeutic intervention. 2) Massive (ie, ?6 cm below the left costal margin) or progressive or symptomatic splenomegaly. 3) Massive nodes (ie, ?10 cm in longest diameter) or progressive  or symptomatic lymphadenopathy. 4) Progressive lymphocytosis with an increase of ?50% over a 7-monthperiod, or lymphocyte doubling time (LDT) <6 months. LDT  can be obtained by linear regression extrapolation of absolute lymphocyte counts obtained at intervals of 2 weeks over an observation period of 2 to 3 months; patients with initial blood lymphocyte counts <30  109/L may require a longer observation period to determine the LDT. Factors contributing to lymphocytosis other than CLL (eg, infections, steroid administration) should be excluded. 5) Autoimmune complications including anemia or thrombocytopenia poorly responsive to corticosteroids. 6) Symptomatic or functional extranodal involvement (eg, skin, kidney, lung, spine). Disease-related symptoms as defined by any of the following: Unintentional weight loss ?10% within the previous 6 months. Significant fatigue (ie, ECOG performance scale 2 or worse; cannot work or unable to perform usual activities). Fevers ?100.34F or 38.0C for 2 or more weeks without evidence of infection. Night sweats for ?1 month without evidence of infection.

## 2022-04-06 NOTE — Telephone Encounter (Signed)
Wife called in today stating that her husband(patient) is not getting better. She said that he's still having severe diarrhea and very weak. They sent a mychart message on yesterday 10/23,but still haven't received a response back.

## 2022-04-06 NOTE — Telephone Encounter (Signed)
I spoke with Mrs Philip Tran and they had not seen the my chart note from Dr Lorelei Pont, I read the note to Mrs Philip Tran and she was very appreciative with Dr Lillie Fragmin response  and was going to pick up  new abx today. Pt's wife will cb if needed.

## 2022-04-07 ENCOUNTER — Inpatient Hospital Stay: Payer: 59 | Attending: Physician Assistant | Admitting: Physician Assistant

## 2022-04-07 ENCOUNTER — Inpatient Hospital Stay: Payer: 59

## 2022-04-07 VITALS — BP 132/86 | HR 71 | Temp 98.6°F | Resp 17 | Ht 65.75 in | Wt 177.1 lb

## 2022-04-07 DIAGNOSIS — R591 Generalized enlarged lymph nodes: Secondary | ICD-10-CM | POA: Diagnosis not present

## 2022-04-07 DIAGNOSIS — R197 Diarrhea, unspecified: Secondary | ICD-10-CM

## 2022-04-07 DIAGNOSIS — R102 Pelvic and perineal pain: Secondary | ICD-10-CM | POA: Insufficient documentation

## 2022-04-07 DIAGNOSIS — M069 Rheumatoid arthritis, unspecified: Secondary | ICD-10-CM | POA: Diagnosis not present

## 2022-04-07 DIAGNOSIS — Z87891 Personal history of nicotine dependence: Secondary | ICD-10-CM | POA: Insufficient documentation

## 2022-04-07 DIAGNOSIS — Z8619 Personal history of other infectious and parasitic diseases: Secondary | ICD-10-CM | POA: Diagnosis not present

## 2022-04-07 DIAGNOSIS — C9110 Chronic lymphocytic leukemia of B-cell type not having achieved remission: Secondary | ICD-10-CM

## 2022-04-07 DIAGNOSIS — R590 Localized enlarged lymph nodes: Secondary | ICD-10-CM | POA: Diagnosis not present

## 2022-04-07 LAB — CMP (CANCER CENTER ONLY)
ALT: 71 U/L — ABNORMAL HIGH (ref 0–44)
AST: 44 U/L — ABNORMAL HIGH (ref 15–41)
Albumin: 3.9 g/dL (ref 3.5–5.0)
Alkaline Phosphatase: 71 U/L (ref 38–126)
Anion gap: 8 (ref 5–15)
BUN: 16 mg/dL (ref 8–23)
CO2: 27 mmol/L (ref 22–32)
Calcium: 9 mg/dL (ref 8.9–10.3)
Chloride: 106 mmol/L (ref 98–111)
Creatinine: 1.07 mg/dL (ref 0.61–1.24)
GFR, Estimated: >60 mL/min (ref >60)
Glucose, Bld: 96 mg/dL (ref 70–99)
Potassium: 4.6 mmol/L (ref 3.5–5.1)
Sodium: 141 mmol/L (ref 135–145)
Total Bilirubin: <0.1 mg/dL — ABNORMAL LOW (ref 0.3–1.2)
Total Protein: 6.5 g/dL (ref 6.5–8.1)

## 2022-04-07 LAB — CBC WITH DIFFERENTIAL (CANCER CENTER ONLY)
Abs Immature Granulocytes: 0.04 10*3/uL (ref 0.00–0.07)
Basophils Absolute: 0.1 10*3/uL (ref 0.0–0.1)
Basophils Relative: 1 %
Eosinophils Absolute: 0.3 10*3/uL (ref 0.0–0.5)
Eosinophils Relative: 2 %
HCT: 42.9 % (ref 39.0–52.0)
Hemoglobin: 14.2 g/dL (ref 13.0–17.0)
Immature Granulocytes: 0 %
Lymphocytes Relative: 63 %
Lymphs Abs: 11.6 10*3/uL — ABNORMAL HIGH (ref 0.7–4.0)
MCH: 28 pg (ref 26.0–34.0)
MCHC: 33.1 g/dL (ref 30.0–36.0)
MCV: 84.6 fL (ref 80.0–100.0)
Monocytes Absolute: 1.9 10*3/uL — ABNORMAL HIGH (ref 0.1–1.0)
Monocytes Relative: 10 %
Neutro Abs: 4.4 10*3/uL (ref 1.7–7.7)
Neutrophils Relative %: 24 %
Platelet Count: 294 10*3/uL (ref 150–400)
RBC: 5.07 MIL/uL (ref 4.22–5.81)
RDW: 15.8 % — ABNORMAL HIGH (ref 11.5–15.5)
WBC Count: 18.3 10*3/uL — ABNORMAL HIGH (ref 4.0–10.5)
nRBC: 0 % (ref 0.0–0.2)

## 2022-04-07 LAB — MAGNESIUM: Magnesium: 2.1 mg/dL (ref 1.7–2.4)

## 2022-04-07 LAB — C-REACTIVE PROTEIN: CRP: 1.5 mg/dL — ABNORMAL HIGH (ref <1.0)

## 2022-04-07 LAB — SEDIMENTATION RATE: Sed Rate: 5 mm/hr (ref 0–16)

## 2022-04-12 ENCOUNTER — Telehealth: Payer: Self-pay | Admitting: Family Medicine

## 2022-04-12 DIAGNOSIS — R197 Diarrhea, unspecified: Secondary | ICD-10-CM

## 2022-04-12 NOTE — Telephone Encounter (Signed)
Patient wife called and stated he is still experiencing diarrhea  and his oncologist suggested he be tested for Cdiff and wanted to know how that can happen. Call back number [phone].

## 2022-04-12 NOTE — Telephone Encounter (Signed)
LVM for patient to stop any antibiotics per Dr Lorelei Pont and to pick up specimen containers for stool testing. Left at the front desk.

## 2022-04-12 NOTE — Telephone Encounter (Signed)
Can you schedule a lab appointment for him?  I put in future orders for stool studies and a C. difficile test.  C. difficile is a type of overgrowth bacteria that can happen after antibiotics, and it causes severe diarrhea.  It is impossible to know for sure if you have this unless you test for it.  I would stop all antibiotics if he is still taking any, because you often get diarrhea just from antibiotics alone.

## 2022-04-13 ENCOUNTER — Other Ambulatory Visit: Payer: Self-pay | Admitting: Radiology

## 2022-04-13 DIAGNOSIS — R197 Diarrhea, unspecified: Secondary | ICD-10-CM

## 2022-04-14 ENCOUNTER — Other Ambulatory Visit: Payer: Self-pay | Admitting: Radiology

## 2022-04-14 DIAGNOSIS — R197 Diarrhea, unspecified: Secondary | ICD-10-CM

## 2022-04-14 LAB — C. DIFFICILE GDH AND TOXIN A/B
GDH ANTIGEN: NOT DETECTED
MICRO NUMBER:: 14124407
SPECIMEN QUALITY:: ADEQUATE
TOXIN A AND B: NOT DETECTED

## 2022-04-18 LAB — STOOL CULTURE: E coli, Shiga toxin Assay: NEGATIVE

## 2022-04-18 LAB — SPECIMEN STATUS REPORT

## 2022-04-22 ENCOUNTER — Ambulatory Visit: Payer: 59 | Admitting: Dermatology

## 2022-05-18 ENCOUNTER — Telehealth: Payer: Self-pay

## 2022-05-18 NOTE — Telephone Encounter (Signed)
Encounter notes from [facility] scanned in under the media tab for your review

## 2022-06-02 ENCOUNTER — Telehealth: Payer: Self-pay

## 2022-06-02 NOTE — Telephone Encounter (Signed)
Updated specimen tracking and history from [facility] progress notes. aw 

## 2022-07-05 ENCOUNTER — Encounter: Payer: Self-pay | Admitting: Family Medicine

## 2022-07-05 ENCOUNTER — Telehealth: Payer: Self-pay | Admitting: Family Medicine

## 2022-07-05 ENCOUNTER — Ambulatory Visit (INDEPENDENT_AMBULATORY_CARE_PROVIDER_SITE_OTHER): Payer: 59 | Admitting: Family Medicine

## 2022-07-05 VITALS — BP 120/68 | HR 68 | Temp 98.3°F | Ht 65.75 in | Wt 182.1 lb

## 2022-07-05 DIAGNOSIS — J0100 Acute maxillary sinusitis, unspecified: Secondary | ICD-10-CM

## 2022-07-05 MED ORDER — AMOXICILLIN-POT CLAVULANATE 875-125 MG PO TABS: 1.0000 | ORAL_TABLET | Freq: Two times a day (BID) | ORAL | 0 refills | Status: DC

## 2022-07-05 NOTE — Telephone Encounter (Signed)
Pt's wife called stating Dr. Lorelei Pont prescribed the meds, AUGMENTIN to pt today during visit. 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 Call back # [phone].

## 2022-07-05 NOTE — Progress Notes (Signed)
Philip Tran T. Philip Lira, MD, [place] at [facility] [place], [address]  Phone: [phone]  FAX: [place] - 67 y.o. male  MRN [id number]  Date of Birth: 1955/08/20  Date: 07/05/2022  PCP: Owens Loffler, MD  Referral: Owens Loffler, MD  Chief Complaint  Patient presents with   Headache   Subjective:   Philip Tran is a 67 y.o. very pleasant male patient with Body mass index is 29.62 kg/m. who presents with the following:  Patient presents with a frontal headache that has been present for roughly 2 weeks.  He has had a lot of nasal congestion during that time.,  And his headache has become progressively worse.  He does not have a history of migraine, and he denies any photophobia, phonophobia, dizziness, blurred vision, or any other additional symptoms aside from headache.  He is having minimal to no significant cough right now.  He does not have an earache, sore throat, nausea, vomiting, diarrhea.  He is having some postnasal drainage.  Frontal sinus pain - has been stuffy for a couple of weeks.  Has been having a lot of congestion.  Sinus pressure and pain  Review of Systems is noted in the HPI, as appropriate  Objective:   BP 120/68   Pulse 68   Temp 98.3 F (36.8 C) (Oral)   Ht 5' 5.75" (1.67 m)   Wt 182 lb 2 oz (82.6 kg)   SpO2 95%   BMI 29.62 kg/m    Gen: WDWN, NAD; alert,appropriate and cooperative throughout exam  HEENT: Normocephalic and atraumatic. Throat clear, w/o exudate, no LAD, R TM clear, L TM - good landmarks, No fluid present. rhinnorhea.  Left frontal and maxillary sinuses: Tender, frontal Right frontal and maxillary sinuses: Tender, frontal  Neck: No ant or post LAD CV: RRR, No M/G/R Pulm: Breathing comfortably in no resp distress. no w/c/r Psych: full affect, pleasant   Laboratory and Imaging Data:  Assessment and Plan:     ICD-10-CM   1.  Acute non-recurrent maxillary sinusitis  J01.00      History and exam are consistent with acute sinusitis.  Think that his preceding congestion led to developing sinusitis.  Initially, placed him on Augmentin, but his wife called and would request that a different antibiotic due to prior history of diarrhea.  I placed him on some doxycycline.  Medication Management during today's office visit: Meds ordered this encounter  Medications   DISCONTD: amoxicillin-clavulanate (AUGMENTIN) 875-125 MG tablet    Sig: Take 1 tablet by mouth 2 (two) times daily.    Dispense:  20 tablet    Refill:  0   doxycycline (VIBRA-TABS) 100 MG tablet    Sig: Take 1 tablet (100 mg total) by mouth 2 (two) times daily.    Dispense:  20 tablet    Refill:  0   Medications Discontinued During This Encounter  Medication Reason   amoxicillin-clavulanate (AUGMENTIN) 875-125 MG tablet Completed Course   amoxicillin-clavulanate (AUGMENTIN) 875-125 MG tablet     Orders placed today for conditions managed today: No orders of the defined types were placed in this encounter.   Disposition: No follow-ups on file.  [employer] One speech-to-text software was used for transcription in this dictation.  Possible transcriptional errors can occur using Editor, commissioning.   Signed,  Maud Deed. Shaneese Tait, MD   Outpatient Encounter Medications as of 07/05/2022  Medication Sig   doxycycline (VIBRA-TABS)  100 MG tablet Take 1 tablet (100 mg total) by mouth 2 (two) times daily.   hydrocortisone (ANUSOL-HC) 2.5 % rectal cream PLACE 1 APPLICATION RECTALLY TWICE DAILY   zolpidem (AMBIEN) 10 MG tablet TAKE 1 TABLET BY MOUTH EVERY NIGHT AT BEDTIME AS NEEDED   [DISCONTINUED] amoxicillin-clavulanate (AUGMENTIN) 875-125 MG tablet Take 1 tablet by mouth 2 (two) times daily.   [DISCONTINUED] amoxicillin-clavulanate (AUGMENTIN) 875-125 MG tablet Take 1 tablet by mouth 2 (two) times daily.   No facility-administered encounter medications  on file as of 07/05/2022.

## 2022-07-06 ENCOUNTER — Telehealth: Payer: Self-pay | Admitting: Family Medicine

## 2022-07-06 MED ORDER — DOXYCYCLINE HYCLATE 100 MG PO TABS: 100.0000 mg | ORAL_TABLET | Freq: Two times a day (BID) | ORAL | 0 refills | Status: DC

## 2022-07-06 NOTE — Telephone Encounter (Signed)
Pts wife called and notified as instructed by Dr Lorelei Pont and pts wife voiced understanding.pts wife said to please add to pts chart that everytime he has taken augmentin pt had diarrhea. Added to contraindications list as requested. Nothing further needed at this time. Pts wife is appreciative of different abx.

## 2022-07-06 NOTE — Telephone Encounter (Signed)
Error

## 2022-07-06 NOTE — Telephone Encounter (Signed)
Can you call them back.  OK to stop Augmentin, and I sent him in some doxycycline instead.

## 2022-07-06 NOTE — Telephone Encounter (Signed)
Left message for Dawn that it's okay to stop or not take the Augmentin.  Dr. Lorelei Pont has  sent him in a different antibiotic called doxycycline instead. I ask that they call us back if the have any questions or concerns.

## 2022-07-08 ENCOUNTER — Ambulatory Visit: Payer: 59 | Admitting: Dermatology

## 2022-08-27 ENCOUNTER — Other Ambulatory Visit: Payer: Self-pay | Admitting: Family Medicine

## 2022-08-28 NOTE — Telephone Encounter (Signed)
Last office visit 07/05/22 for sinusitis.  Last refilled 03/10/22 for #30 with 5 refills.  Next Appt: No future appointments with PCP.

## 2022-09-16 ENCOUNTER — Ambulatory Visit (INDEPENDENT_AMBULATORY_CARE_PROVIDER_SITE_OTHER): Payer: 59 | Admitting: Dermatology

## 2022-09-16 ENCOUNTER — Encounter: Payer: Self-pay | Admitting: Dermatology

## 2022-09-16 VITALS — BP 150/89 | HR 63

## 2022-09-16 DIAGNOSIS — Z1283 Encounter for screening for malignant neoplasm of skin: Secondary | ICD-10-CM

## 2022-09-16 DIAGNOSIS — L578 Other skin changes due to chronic exposure to nonionizing radiation: Secondary | ICD-10-CM

## 2022-09-16 DIAGNOSIS — L814 Other melanin hyperpigmentation: Secondary | ICD-10-CM

## 2022-09-16 DIAGNOSIS — R229 Localized swelling, mass and lump, unspecified: Secondary | ICD-10-CM

## 2022-09-16 DIAGNOSIS — L570 Actinic keratosis: Secondary | ICD-10-CM

## 2022-09-16 DIAGNOSIS — L738 Other specified follicular disorders: Secondary | ICD-10-CM

## 2022-09-16 DIAGNOSIS — Z85828 Personal history of other malignant neoplasm of skin: Secondary | ICD-10-CM

## 2022-09-16 DIAGNOSIS — L821 Other seborrheic keratosis: Secondary | ICD-10-CM

## 2022-09-16 DIAGNOSIS — L720 Epidermal cyst: Secondary | ICD-10-CM

## 2022-09-16 DIAGNOSIS — D229 Melanocytic nevi, unspecified: Secondary | ICD-10-CM

## 2022-09-16 MED ORDER — DOXYCYCLINE MONOHYDRATE 100 MG PO CAPS: 100.0000 mg | ORAL_CAPSULE | Freq: Two times a day (BID) | ORAL | 0 refills | Status: AC

## 2022-09-16 NOTE — Progress Notes (Signed)
Follow-Up Visit   Subjective  Philip Tran is a 67 y.o. male who presents for the following: Skin Cancer Screening and Full Body Skin Exam  Concerned about right thigh and right flank, hx of bcc   The patient presents for Total-Body Skin Exam (TBSE) for skin cancer screening and mole check. The patient has spots, moles and lesions to be evaluated, some may be new or changing and the patient has concerns that these could be cancer.   The following portions of the chart were reviewed this encounter and updated as appropriate: medications, allergies, medical history  Review of Systems:  No other skin or systemic complaints except as noted in HPI or Assessment and Plan.  Objective  Well appearing patient in no apparent distress; mood and affect are within normal limits.  A full examination was performed including scalp, head, eyes, ears, nose, lips, neck, chest, axillae, abdomen, back, buttocks, bilateral upper extremities, bilateral lower extremities, hands, feet, fingers, toes, fingernails, and toenails. All findings within normal limits unless otherwise noted below.   Relevant physical exam findings are noted in the Assessment and Plan.    Assessment & Plan   ACTINIC KERATOSIS Exam: Erythematous thin papules/macules with gritty scale  Actinic keratoses are precancerous spots that appear secondary to cumulative UV radiation exposure/sun exposure over time. They are chronic with expected duration over 1 year. A portion of actinic keratoses will progress to squamous cell carcinoma of the skin. It is not possible to reliably predict which spots will progress to skin cancer and so treatment is recommended to prevent development of skin cancer.  Recommend daily broad spectrum sunscreen SPF 30+ to sun-exposed areas, reapply every 2 hours as needed.  Recommend staying in the shade or wearing long sleeves, sun glasses (UVA+UVB protection) and wide brim hats (4-inch brim around the entire  circumference of the hat). Call for new or changing lesions.  Treatment Plan:  Prior to procedure, discussed risks of blister formation, small wound, skin dyspigmentation, or rare scar following cryotherapy. Recommend Vaseline ointment to treated areas while healing.  Destruction Procedure Note Destruction method: cryotherapy   Informed consent: discussed and consent obtained   Lesion destroyed using liquid nitrogen: Yes   Outcome: patient tolerated procedure well with no complications   Post-procedure details: wound care instructions given   Locations: left forearm x 1 # of Lesions Treated: 1  Swelling at left lower lip  Left lower cutaneous lip erythematous papule no fluctuance   Possible early infection/folliculitis. No fluctuance to suggest abscess today. Advised if it worsens over the weekend, can take doxycycline 100 mg tablet by mouth twice a day for 7 days. #14, 0 refills Advised if worsening despite antibiotic or worsening significantly to seek medical care.  Doxycycline should be taken with food to prevent nausea. Do not lay down for 30 minutes after taking. Be cautious with sun exposure and use good sun protection while on this medication. Pregnant women should not take this medication.    LENTIGINES, SEBORRHEIC KERATOSES at right thigh, HEMANGIOMAS - Benign normal skin lesions - Benign-appearing - Call for any changes  Sebaceous Hyperplasia - Small yellow papules with a central dell - Benign-appearing - Observe. Call for changes.  EPIDERMAL INCLUSION CYST Exam: Subcutaneous nodule at left forehead  Benign-appearing. Exam most consistent with an epidermal inclusion cyst. Discussed that a cyst is a benign growth that can grow over time and sometimes get irritated or inflamed. Recommend observation if it is not bothersome. Discussed option of surgical  excision to remove it if it is growing, symptomatic, or other changes noted. Please call for new or changing lesions so  they can be evaluated.   MELANOCYTIC NEVI - Tan-brown and/or pink-flesh-colored symmetric macules and papules - Benign appearing on exam today - Observation - Call clinic for new or changing moles - Recommend daily use of broad spectrum spf 30+ sunscreen to sun-exposed areas.   ACTINIC DAMAGE - Chronic condition, secondary to cumulative UV/sun exposure - diffuse scaly erythematous macules with underlying dyspigmentation - Recommend daily broad spectrum sunscreen SPF 30+ to sun-exposed areas, reapply every 2 hours as needed.  - Staying in the shade or wearing long sleeves, sun glasses (UVA+UVB protection) and wide brim hats (4-inch brim around the entire circumference of the hat) are also recommended for sun protection.  - Call for new or changing lesions.  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  - Call if any new or changing lesions are noted between office visits   SKIN CANCER SCREENING PERFORMED TODAY.   Return in about 9 months (around 06/18/2023) for tbse hx of bcc hx of aks.  I, Asher MuirMelissa Wilson, CMA, am acting as scribe for Darden DatesVIRGINA Lyndell Allaire, MD.   Documentation: I have reviewed the above documentation for accuracy and completeness, and I agree with the above.  Darden DatesVIRGINA Damoni Causby, MD

## 2022-09-16 NOTE — Patient Instructions (Addendum)
If swelling at left lower lip is getting worst  Start doxycycline 100 mg capsule - take 1 capsule by mouth twice daily for 7 days with food and drink.   Doxycycline should be taken with food to prevent nausea. Do not lay down for 30 minutes after taking. Be cautious with sun exposure and use good sun protection while on this medication. Pregnant women should not take this medication.    Dr. Ellard Artis in [facility] in [place] or Nicotinamide 500mg  twice per day to lower risk of non-melanoma skin cancer by approximately 25%. This is usually available at [employer].   Recommend taking Heliocare sun protection supplement daily in sunny weather for additional sun protection. For maximum protection on the sunniest days, you can take up to 2 capsules of regular Heliocare OR take 1 capsule of Heliocare Ultra. For prolonged exposure (such as a full day in the sun), you can repeat your dose of the supplement 4 hours after your first dose. Heliocare can be purchased at [employer], at some [facility] or at [email or web address].       Actinic keratoses are precancerous spots that appear secondary to cumulative UV radiation exposure/sun exposure over time. They are chronic with expected duration over 1 year. A portion of actinic keratoses will progress to squamous cell carcinoma of the skin. It is not possible to reliably predict which spots will progress to skin cancer and so treatment is recommended to prevent development of skin cancer.  Recommend daily broad spectrum sunscreen SPF 30+ to sun-exposed areas, reapply every 2 hours as needed.  Recommend staying in the shade or wearing long sleeves, sun glasses (UVA+UVB protection) and wide brim hats (4-inch brim around the entire circumference of the hat). Call for new or changing lesions.   Cryotherapy Aftercare  Wash gently with soap and water everyday.   Apply Vaseline and Band-Aid daily until  healed.    Seborrheic Keratosis  What causes seborrheic keratoses? Seborrheic keratoses are harmless, common skin growths that first appear during adult life.  As time goes by, more growths appear.  Some people may develop a large number of them.  Seborrheic keratoses appear on both covered and uncovered body parts.  They are not caused by sunlight.  The tendency to develop seborrheic keratoses can be inherited.  They vary in color from skin-colored to gray, brown, or even black.  They can be either smooth or have a rough, warty surface.   Seborrheic keratoses are superficial and look as if they were stuck on the skin.  Under the microscope this type of keratosis looks like layers upon layers of skin.  That is why at times the top layer may seem to fall off, but the rest of the growth remains and re-grows.    Treatment Seborrheic keratoses do not need to be treated, but can easily be removed in the office.  Seborrheic keratoses often cause symptoms when they rub on clothing or jewelry.  Lesions can be in the way of shaving.  If they become inflamed, they can cause itching, soreness, or burning.  Removal of a seborrheic keratosis can be accomplished by freezing, burning, or surgery. If any spot bleeds, scabs, or grows rapidly, please return to have it checked, as these can be an indication of a skin cancer.    Melanoma ABCDEs  Melanoma is the most dangerous type of skin cancer, and is the leading cause of death from skin disease.  You are  more likely to develop melanoma if you: Have light-colored skin, light-colored eyes, or red or blond hair Spend a lot of time in the sun Tan regularly, either outdoors or in a tanning bed Have had blistering sunburns, especially during childhood Have a close family member who has had a melanoma Have atypical moles or large birthmarks  Early detection of melanoma is key since treatment is typically straightforward and cure rates are extremely high if we catch  it early.   The first sign of melanoma is often a change in a mole or a new dark spot.  The ABCDE system is a way of remembering the signs of melanoma.  A for asymmetry:  The two halves do not match. B for border:  The edges of the growth are irregular. C for color:  A mixture of colors are present instead of an even brown color. D for diameter:  Melanomas are usually (but not always) greater than 41mm - the size of a pencil eraser. E for evolution:  The spot keeps changing in size, shape, and color.  Please check your skin once per month between visits. You can use a small mirror in front and a large mirror behind you to keep an eye on the back side or your body.   If you see any new or changing lesions before your next follow-up, please call to schedule a visit.  Please continue daily skin protection including broad spectrum sunscreen SPF 30+ to sun-exposed areas, reapplying every 2 hours as needed when you're outdoors.   Staying in the shade or wearing long sleeves, sun glasses (UVA+UVB protection) and wide brim hats (4-inch brim around the entire circumference of the hat) are also recommended for sun protection.    Due to recent changes in healthcare laws, you may see results of your pathology and/or laboratory studies on MyChart before the doctors have had a chance to review them. We understand that in some cases there may be results that are confusing or concerning to you. Please understand that not all results are received at the same time and often the doctors may need to interpret multiple results in order to provide you with the best plan of care or course of treatment. Therefore, we ask that you please give [place] 2 business days to thoroughly review all your results before contacting the office for clarification. Should we see a critical lab result, you will be contacted sooner.   If You Need Anything After Your Visit  If you have any questions or concerns for your doctor, please call  our main line at [phone] and press option 4 to reach your doctor's medical assistant. If no one answers, please leave a voicemail as directed and we will return your call as soon as possible. Messages left after 4 pm will be answered the following business day.   You may also send [place] a message via [place]. We typically respond to MyChart messages within 1-2 business days.  For prescription refills, please ask your pharmacy to contact our office. Our fax number is [phone].  If you have an urgent issue when the clinic is closed that cannot wait until the next business day, you can page your doctor at the number below.    Please note that while we do our best to be available for urgent issues outside of office hours, we are not available 24/7.   If you have an urgent issue and are unable to reach [place], you may choose to seek medical  care at your doctor's office, retail clinic, urgent care center, or emergency room.  If you have a medical emergency, please immediately call 911 or go to the emergency department.  Pager Numbers  - Dr. Nehemiah Massed: [phone]  - Dr. Laurence Ferrari: [phone]  - Dr. Nicole Kindred: [phone]  In the event of inclement weather, please call our main line at [phone] for an update on the status of any delays or closures.  Dermatology Medication Tips: Please keep the boxes that topical medications come in in order to help keep track of the instructions about where and how to use these. Pharmacies typically print the medication instructions only on the boxes and not directly on the medication tubes.   If your medication is too expensive, please contact our office at [phone] option 4 or send [place] a message through [place].   We are unable to tell what your co-pay for medications will be in advance as this is different depending on your insurance coverage. However, we may be able to find a substitute medication at lower cost or fill out paperwork to get insurance to  cover a needed medication.   If a prior authorization is required to get your medication covered by your insurance company, please allow [place] 1-2 business days to complete this process.  Drug prices often vary depending on where the prescription is filled and some pharmacies may offer cheaper prices.  The website www.goodrx.com contains coupons for medications through different pharmacies. The prices here do not account for what the cost may be with help from insurance (it may be cheaper with your insurance), but the website can give you the price if you did not use any insurance.  - You can print the associated coupon and take it with your prescription to the pharmacy.  - You may also stop by our office during regular business hours and pick up a GoodRx coupon card.  - If you need your prescription sent electronically to a different pharmacy, notify our office through [facility] or by phone at [phone] option 4.     Si Usted Necesita Algo Despus de Su Visita  Tambin puede enviarnos un mensaje a travs de Pharmacist, community. Por lo general respondemos a los mensajes de MyChart en el transcurso de 1 a 2 das hbiles.  Para renovar recetas, por favor pida a su farmacia que se ponga en contacto con nuestra oficina. Harland Dingwall de fax es [place] [phone].  Si tiene un asunto urgente cuando la clnica est cerrada y que no puede esperar hasta el siguiente da hbil, puede llamar/localizar a su doctor(a) al nmero que aparece a continuacin.   Por favor, tenga en cuenta que aunque hacemos todo lo posible para estar disponibles para asuntos urgentes fuera del horario de [place], no estamos disponibles las 24 horas del da, los 7 das de la [place].   Si tiene un problema urgente y no puede comunicarse con nosotros, puede optar por buscar atencin mdica  en el consultorio de su doctor(a), en una clnica privada, en un centro de atencin urgente o en una sala de emergencias.  Si tiene Conservator, museum/gallery, por favor llame inmediatamente al 911 o vaya a la sala de emergencias.  Nmeros de bper  - Dr. Nehemiah Massed: [phone]  - Dra. Moye: [phone]  - Dra. Nicole Kindred: [phone]  En caso de inclemencias del [place], por favor llame a Johnsie Kindred principal al [phone] para una actualizacin sobre el [place] de cualquier retraso o cierre.  Consejos para la medicacin  en dermatologa: Por favor, guarde las cajas en las que vienen los medicamentos de uso tpico para ayudarle a seguir las instrucciones sobre dnde y cmo usarlos. Las farmacias generalmente imprimen las instrucciones del medicamento slo en las cajas y no directamente en los tubos del [place].   Si su medicamento es muy caro, por favor, pngase en contacto con Zigmund Daniel llamando al [phone] y presione la opcin 4 o envenos un mensaje a travs de Pharmacist, community.   No podemos decirle cul ser su copago por los medicamentos por adelantado ya que esto es diferente dependiendo de la cobertura de su seguro. Sin embargo, es posible que podamos encontrar un medicamento sustituto a Electrical engineer un formulario para que el seguro cubra el medicamento que se considera necesario.   Si se requiere una autorizacin previa para que su compaa de seguros [place] su medicamento, por favor permtanos de 1 a 2 das hbiles para completar este proceso.  Los precios de los medicamentos varan con frecuencia dependiendo del Environmental consultant de dnde se surte la receta y alguna farmacias pueden ofrecer precios ms baratos.  El sitio web www.goodrx.com tiene cupones para medicamentos de Airline pilot. Los precios aqu no tienen en cuenta lo que podra costar con la ayuda del seguro (puede ser ms barato con su seguro), pero el sitio web puede darle el precio si no utiliz Research scientist (physical sciences).  - Puede imprimir el cupn correspondiente y llevarlo con su receta a la farmacia.  - Tambin puede pasar por nuestra oficina durante el  horario de atencin regular y Charity fundraiser una tarjeta de cupones de GoodRx.  - Si necesita que su receta se enve electrnicamente a una farmacia diferente, informe a nuestra oficina a travs de MyChart de [place] o por telfono llamando al [phone] y presione la opcin 4.

## 2022-11-19 NOTE — Telephone Encounter (Signed)
Reviewed. MAs please confirm that patient is scheduled for FBSE and mark off in the biopsy book. Thank you! 

## 2023-01-10 ENCOUNTER — Encounter: Payer: Self-pay | Admitting: Gastroenterology

## 2023-01-24 ENCOUNTER — Other Ambulatory Visit: Payer: Self-pay | Admitting: Family Medicine

## 2023-01-24 NOTE — Telephone Encounter (Signed)
Last office visit 07/05/2022 for sinusitis.  Last refilled Zolpidem 08/29/2022 for #30 with 5 refills.  Anusol-HC 10/06/2021 for 30 g with 2 refills.  Next Appt: No future appointments with PCP.    Please call and schedule CPE with fasting labs prior with Dr Patsy Lager for sometime after 02/19/23.

## 2023-01-25 NOTE — Telephone Encounter (Signed)
Spoke with patient wife and she will have him call and schedule.

## 2023-02-25 ENCOUNTER — Telehealth: Payer: Self-pay | Admitting: *Deleted

## 2023-02-25 DIAGNOSIS — Z13220 Encounter for screening for lipoid disorders: Secondary | ICD-10-CM

## 2023-02-25 DIAGNOSIS — Z79899 Other long term (current) drug therapy: Secondary | ICD-10-CM

## 2023-02-25 DIAGNOSIS — Z125 Encounter for screening for malignant neoplasm of prostate: Secondary | ICD-10-CM

## 2023-02-25 NOTE — Telephone Encounter (Signed)
-----   Message from Lovena Neighbours sent at 02/25/2023 10:01 AM EDT ----- Regarding: Labs for Monday 9.16.24 Please put lab orders in future. Thank you, Denny Peon

## 2023-02-28 ENCOUNTER — Other Ambulatory Visit: Payer: 59

## 2023-03-01 ENCOUNTER — Ambulatory Visit (AMBULATORY_SURGERY_CENTER): Payer: 59 | Admitting: *Deleted

## 2023-03-01 VITALS — Ht 66.0 in | Wt 175.0 lb

## 2023-03-01 DIAGNOSIS — Z86010 Personal history of colonic polyps: Secondary | ICD-10-CM

## 2023-03-01 MED ORDER — NA SULFATE-K SULFATE-MG SULF 17.5-3.13-1.6 GM/177ML PO SOLN: 1.0000 | Freq: Once | ORAL | 0 refills | Status: AC

## 2023-03-01 NOTE — Progress Notes (Addendum)
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  Pt denies any difficulty with ambulating,sitting, laying down or rolling side to side Gave both [facility] main # and MD on call # prior to instructions.  No egg or soy allergy known to patient  No issues known to pt with past sedation with any surgeries or procedures Pt denies having issues being intubated Pt has no issues moving head neck or swallowing No FH of Malignant Hyperthermia Pt is not on diet pills Pt is not on home 02  Pt is not on blood thinners  Pt denies issues with constipation  Pt is not on dialysis Pt denise any abnormal heart rhythms  Pt denies any upcoming cardiac testing Pt encouraged to use to use Singlecare or Goodrx to reduce cost  Patient's chart reviewed by Philip Tran CNRA prior to pre-visit and patient appropriate for the [facility].  Pre-visit completed and red dot placed by patient's name on their procedure day (on provider's schedule).  . Visit by phone Pt states weight is 175 lb Instructed pt why it is important to and  to call if they have any changes in health or new medications. Directed them to the # given and on instructions.   Pt states they will.  Instructions reviewed with pt and pt states understanding. Instructed to review again prior to procedure. Pt states they will.  Instructions sent by mail with coupon and by my chart

## 2023-03-06 NOTE — Progress Notes (Unsigned)
Philip Tran. Philip Ballow, MD, CAQ [facility] [address] [name], [address]  Phone: [phone]  FAX: [phone]  TRIUMPH BECHERER - 67 y.o. male  MRN [id number]  Date of Birth: [birth year]  Date: 03/07/2023  PCP: [name], MD  Referral: [name], MD  No chief complaint on file.  Patient Care Team: [name], MD as PCP - General Subjective:   Philip Tran is a 67 y.o. pleasant patient who presents with the following:  Preventative Health Maintenance Visit:  Health Maintenance Summary Reviewed and updated, unless pt declines services.  Tobacco History Reviewed. Alcohol: No concerns, no excessive use Exercise Habits: Some activity, rec at least 30 mins 5 times a week STD concerns: no risk or activity to increase risk Drug Use: None  Prevnar-20 Shingrix RSV Flu Covid booster    Health Maintenance  Topic Date Due   Pneumonia Vaccine 2+ Years old (1 of 2 - PCV) Never done   Zoster Vaccines- Shingrix (1 of 2) Never done   COVID-19 Vaccine (3 - Moderna risk series) 10/31/2019   Colonoscopy  11/11/2020   DTaP/Tdap/Td (2 - Td or Tdap) 05/06/2021   INFLUENZA VACCINE  01/13/2023   Hepatitis C Screening  Completed   HIV Screening  Completed   HPV VACCINES  Aged Out   Immunization History  Administered Date(s) Administered   Hepatitis A, Adult 09/03/2010, 08/24/2011   Influenza Split 03/03/2011   Influenza Whole 04/12/2008, 03/12/2009   Influenza, Seasonal, Injecte, Preservative Fre 03/13/2012   Influenza,inj,Quad PF,6+ Mos 01/31/2014, 03/06/2015, 03/05/2016, 04/12/2017, 03/02/2018, 03/06/2019   Influenza,inj,quad, With Preservative 03/23/2013   Moderna Sars-Covid-2 Vaccination 09/05/2019, 10/03/2019   Tdap 05/07/2011   Patient Active Problem List   Diagnosis Date Noted   CLL (chronic lymphocytic leukemia) (HCC) 03/03/2011    Priority: High   Chronic hepatitis C virus infection  (HCC) 12/10/2009    Priority: High   History of total right hip arthroplasty 02/01/2014   Primary osteoarthritis of left hip 02/01/2014   Rheumatoid arthritis(714.0) 03/05/2011    Past Medical History:  Diagnosis Date   BCC (basal cell carcinoma) 02/09/2022   right cheek, Mohs 05/17/22   CLL (chronic lymphoblastic leukemia) 01/12/2010   Dr. Welton Flakes   Hemorrhoid    Hepatitis B infection    Prior but patient not chronically infected   Hepatitis C antibody test positive    Chronic Hep C   History of total right hip arthroplasty 02/01/2014   Rheumatoid arthritis (HCC)     Past Surgical History:  Procedure Laterality Date   COLONOSCOPY     HERNIA REPAIR     TOTAL HIP ARTHROPLASTY Right 06/14/2012   [facility]    Family History  Problem Relation Age of Onset   Colon polyps Neg Hx    Colon cancer Neg Hx    Esophageal cancer Neg Hx    Prostate cancer Neg Hx    Rectal cancer Neg Hx     Social History   Social History Narrative   Avid basketball player, Teacher, English as a foreign language, Ship broker.   History of remote IV drug use, clean for many years now.    Past Medical History, Surgical History, Social History, Family History, Problem List, Medications, and Allergies have been reviewed and updated if relevant.  Review of Systems: Pertinent positives are listed above.  Otherwise, a full 14 point review of systems has been done in full and it is negative except where it is noted positive.  Objective:   There were no vitals taken for this visit. Ideal Body Weight:    Ideal Body Weight:   No results found.    07/05/2022    3:42 PM 02/18/2022    2:50 PM 08/15/2019    2:52 PM 04/21/2017   12:10 PM  Depression screen PHQ 2/9  Decreased Interest 0 0 0 0  Down, Depressed, Hopeless 0 0 0 0  PHQ - 2 Score 0 0 0 0     GEN: well developed, well nourished, no acute distress Eyes: conjunctiva and lids normal, PERRLA, EOMI ENT: TM clear, nares clear, oral exam WNL Neck: supple, no  lymphadenopathy, no thyromegaly, no JVD Pulm: clear to auscultation and percussion, respiratory effort normal CV: regular rate and rhythm, S1-S2, no murmur, rub or gallop, no bruits, peripheral pulses normal and symmetric, no cyanosis, clubbing, edema or varicosities GI: soft, non-tender; no hepatosplenomegaly, masses; active bowel sounds all quadrants GU: deferred Lymph: no cervical, axillary or inguinal adenopathy MSK: gait normal, muscle tone and strength WNL, no joint swelling, effusions, discoloration, crepitus  SKIN: clear, good turgor, color WNL, no rashes, lesions, or ulcerations Neuro: normal mental status, normal strength, sensation, and motion Psych: alert; oriented to person, place and time, normally interactive and not anxious or depressed in appearance.  All labs reviewed with patient. Results for orders placed or performed in visit on 04/14/22  Stool culture   Specimen: Stool   Stool  Result Value Ref Range   Salmonella/Shigella Screen Final report    Stool Culture result 1 (RSASHR) Comment    Campylobacter Culture Final report    Stool Culture result 1 (CMPCXR) Comment    E coli, Shiga toxin Assay Negative Negative  Specimen status report  Result Value Ref Range   specimen status report Comment     Assessment and Plan:     ICD-10-CM   1. Healthcare maintenance  Z00.00       Health Maintenance Exam: The patient's preventative maintenance and recommended screening tests for an annual wellness exam were reviewed in full today. Brought up to date unless services declined.  Counselled on the importance of diet, exercise, and its role in overall health and mortality. The patient's FH and SH was reviewed, including their home life, tobacco status, and drug and alcohol status.  Follow-up in 1 year for physical exam or additional follow-up below.  Disposition: No follow-ups on file.  No orders of the defined types were placed in this encounter.  There are no  discontinued medications. No orders of the defined types were placed in this encounter.   Signed,  [name]. Rayder Sullenger, MD   Allergies as of 03/07/2023       Reactions   Augmentin [amoxicillin-pot Clavulanate] Diarrhea   Codeine Nausea Only        Medication List        Accurate as of March 06, 2023  4:07 PM. If you have any questions, ask your nurse or doctor.          doxycycline 100 MG tablet Commonly known as: VIBRA-TABS Take 1 tablet (100 mg total) by mouth 2 (two) times daily.   hydrocortisone 2.5 % rectal cream Commonly known as: ANUSOL-HC PLACE ONE APPLICATION RECTALLY TWICE DAILY   multivitamin capsule Take 1 capsule by mouth daily.   zolpidem 10 MG tablet Commonly known as: AMBIEN TAKE ONE TABLET BY MOUTH EVERY NIGHT AT BEDTIME AS NEEDED

## 2023-03-07 ENCOUNTER — Encounter: Payer: Self-pay | Admitting: Family Medicine

## 2023-03-07 ENCOUNTER — Ambulatory Visit (INDEPENDENT_AMBULATORY_CARE_PROVIDER_SITE_OTHER): Payer: 59 | Admitting: Family Medicine

## 2023-03-07 VITALS — BP 114/70 | HR 80 | Temp 98.2°F | Ht 65.75 in | Wt 173.1 lb

## 2023-03-07 DIAGNOSIS — Z125 Encounter for screening for malignant neoplasm of prostate: Secondary | ICD-10-CM

## 2023-03-07 DIAGNOSIS — Z79899 Other long term (current) drug therapy: Secondary | ICD-10-CM | POA: Diagnosis not present

## 2023-03-07 DIAGNOSIS — Z13220 Encounter for screening for lipoid disorders: Secondary | ICD-10-CM | POA: Diagnosis not present

## 2023-03-07 DIAGNOSIS — Z0000 Encounter for general adult medical examination without abnormal findings: Secondary | ICD-10-CM

## 2023-03-08 ENCOUNTER — Telehealth: Payer: Self-pay | Admitting: Hematology and Oncology

## 2023-03-08 LAB — CBC WITH DIFFERENTIAL/PLATELET
Basophils Absolute: 0.1 10*3/uL (ref 0.0–0.1)
Basophils Relative: 0.8 % (ref 0.0–3.0)
Eosinophils Absolute: 0.1 10*3/uL (ref 0.0–0.7)
Eosinophils Relative: 1 % (ref 0.0–5.0)
HCT: 46.3 % (ref 39.0–52.0)
Hemoglobin: 15 g/dL (ref 13.0–17.0)
Lymphocytes Relative: 63.7 % — ABNORMAL HIGH (ref 12.0–46.0)
Lymphs Abs: 9.4 10*3/uL — ABNORMAL HIGH (ref 0.7–4.0)
MCHC: 32.3 g/dL (ref 30.0–36.0)
MCV: 83.5 fl (ref 78.0–100.0)
Monocytes Absolute: 0.3 10*3/uL (ref 0.1–1.0)
Monocytes Relative: 2.3 % — ABNORMAL LOW (ref 3.0–12.0)
Neutro Abs: 4.7 10*3/uL (ref 1.4–7.7)
Neutrophils Relative %: 32.2 % — ABNORMAL LOW (ref 43.0–77.0)
Platelets: 235 10*3/uL (ref 150.0–400.0)
RBC: 5.55 Mil/uL (ref 4.22–5.81)
RDW: 15.9 % — ABNORMAL HIGH (ref 11.5–15.5)
WBC: 14.7 10*3/uL — ABNORMAL HIGH (ref 4.0–10.5)

## 2023-03-08 LAB — LIPID PANEL
Cholesterol: 221 mg/dL — ABNORMAL HIGH (ref 0–200)
HDL: 34.6 mg/dL — ABNORMAL LOW (ref >39.00)
LDL Cholesterol: 155 mg/dL — ABNORMAL HIGH (ref 0–99)
NonHDL: 186.81
Total CHOL/HDL Ratio: 6
Triglycerides: 157 mg/dL — ABNORMAL HIGH (ref 0.0–149.0)
VLDL: 31.4 mg/dL (ref 0.0–40.0)

## 2023-03-08 LAB — HEPATIC FUNCTION PANEL
ALT: 22 U/L (ref 0–53)
AST: 25 U/L (ref 0–37)
Albumin: 4.5 g/dL (ref 3.5–5.2)
Alkaline Phosphatase: 92 U/L (ref 39–117)
Bilirubin, Direct: 0.1 mg/dL (ref 0.0–0.3)
Total Bilirubin: 0.5 mg/dL (ref 0.2–1.2)
Total Protein: 6.5 g/dL (ref 6.0–8.3)

## 2023-03-08 LAB — BASIC METABOLIC PANEL
BUN: 13 mg/dL (ref 6–23)
CO2: 29 mEq/L (ref 19–32)
Calcium: 9.9 mg/dL (ref 8.4–10.5)
Chloride: 105 mEq/L (ref 96–112)
Creatinine, Ser: 1.01 mg/dL (ref 0.40–1.50)
GFR: 78.31 mL/min (ref >60.00)
Glucose, Bld: 90 mg/dL (ref 70–99)
Potassium: 4.2 mEq/L (ref 3.5–5.1)
Sodium: 143 mEq/L (ref 135–145)

## 2023-03-08 LAB — PSA, TOTAL WITH REFLEX TO PSA, FREE: PSA, Total: 1.4 ng/mL (ref <=4.0)

## 2023-03-09 ENCOUNTER — Encounter: Payer: Self-pay | Admitting: Family Medicine

## 2023-03-14 ENCOUNTER — Encounter: Payer: Self-pay | Admitting: Gastroenterology

## 2023-03-21 ENCOUNTER — Encounter: Payer: 59 | Admitting: Gastroenterology

## 2023-04-04 ENCOUNTER — Ambulatory Visit: Payer: 59 | Admitting: Hematology and Oncology

## 2023-04-04 ENCOUNTER — Other Ambulatory Visit: Payer: 59

## 2023-04-21 DIAGNOSIS — H524 Presbyopia: Secondary | ICD-10-CM | POA: Diagnosis not present

## 2023-04-21 DIAGNOSIS — Z135 Encounter for screening for eye and ear disorders: Secondary | ICD-10-CM | POA: Diagnosis not present

## 2023-04-21 DIAGNOSIS — H52223 Regular astigmatism, bilateral: Secondary | ICD-10-CM | POA: Diagnosis not present

## 2023-04-21 DIAGNOSIS — H2513 Age-related nuclear cataract, bilateral: Secondary | ICD-10-CM | POA: Diagnosis not present

## 2023-04-21 DIAGNOSIS — H35361 Drusen (degenerative) of macula, right eye: Secondary | ICD-10-CM | POA: Diagnosis not present

## 2023-05-05 DIAGNOSIS — R69 Illness, unspecified: Secondary | ICD-10-CM | POA: Diagnosis not present

## 2023-05-31 ENCOUNTER — Emergency Department: Payer: Worker's Compensation

## 2023-05-31 ENCOUNTER — Other Ambulatory Visit: Payer: Self-pay

## 2023-05-31 ENCOUNTER — Emergency Department
Admission: EM | Admit: 2023-05-31 | Discharge: 2023-05-31 | Disposition: A | Discharge status: Home / Self Care | Payer: Worker's Compensation | Attending: Emergency Medicine | Admitting: Emergency Medicine

## 2023-05-31 DIAGNOSIS — X58XXXA Exposure to other specified factors, initial encounter: Secondary | ICD-10-CM | POA: Diagnosis not present

## 2023-05-31 DIAGNOSIS — Z856 Personal history of leukemia: Secondary | ICD-10-CM | POA: Insufficient documentation

## 2023-05-31 DIAGNOSIS — S199XXA Unspecified injury of neck, initial encounter: Secondary | ICD-10-CM | POA: Diagnosis present

## 2023-05-31 DIAGNOSIS — Y990 Civilian activity done for income or pay: Secondary | ICD-10-CM | POA: Insufficient documentation

## 2023-05-31 DIAGNOSIS — S134XXA Sprain of ligaments of cervical spine, initial encounter: Secondary | ICD-10-CM | POA: Insufficient documentation

## 2023-05-31 DIAGNOSIS — S139XXA Sprain of joints and ligaments of unspecified parts of neck, initial encounter: Secondary | ICD-10-CM

## 2023-05-31 MED ORDER — MELOXICAM 15 MG PO TABS: 15.0000 mg | ORAL_TABLET | Freq: Every day | ORAL | 0 refills | Status: AC

## 2023-05-31 NOTE — ED Notes (Signed)
Patient discharged to home per MD order. Patient in stable condition, and deemed medically cleared by ED provider for discharge. Discharge instructions reviewed with patient/family using "Teach Back"; verbalized understanding of medication education and administration, and information about follow-up care. Denies further concerns. ° °

## 2023-05-31 NOTE — ED Provider Notes (Signed)
[facility] Provider Note    None    (approximate)   History   Neck Injury   HPI  Philip Tran is a 67 y.o. male  with PMH of CLL, RA who presents for evaluation of a neck injury that occurred on Friday at work. Patient states he was holding on to a cable that snapped, jerking his entire body. He has had persistent neck pain since the injury. He has tried taking tylenol and ibuprofen without significant relief. He was seen by a different provider for workman's comp who recommended he come to the ED for more advanced imaging. He had a neck xray already. He reports occasional tingling and numbness in the finger tips but no weakness.       Physical Exam   Triage Vital Signs: ED Triage Vitals  Encounter Vitals Group     BP 05/31/23 1815 (!) 159/89     Systolic BP Percentile --      Diastolic BP Percentile --      Pulse Rate 05/31/23 1814 75     Resp 05/31/23 1814 18     Temp 05/31/23 1814 98.5 F (36.9 C)     Temp Source 05/31/23 1814 Oral     SpO2 05/31/23 1814 98 %     Weight 05/31/23 1815 168 lb (76.2 kg)     Height 05/31/23 1815 5\' 7"  (1.702 m)     Head Circumference --      Peak Flow --      Pain Score 05/31/23 1815 8     Pain Loc --      Pain Education --      Exclude from Growth Chart --     Most recent vital signs: Vitals:   05/31/23 1814 05/31/23 1815  BP:  (!) 159/89  Pulse: 75   Resp: 18   Temp: 98.5 F (36.9 C)   SpO2: 98%     General: Awake, no distress.  CV:  Good peripheral perfusion.  Resp:  Normal effort.  Abd:  No distention.  Other:  TTP over the cervical vertebral spines, mild discomfort with neck extension, lateral rotation and lateral flexion,    ED Results / Procedures / Treatments   Labs (all labs ordered are listed, but only abnormal results are displayed) Labs Reviewed - No data to display  RADIOLOGY  CT cervical spine obtained, I interpreted the images and reviewed the radiologist report which was  negative.  PROCEDURES:  Critical Care performed: No  Procedures   MEDICATIONS ORDERED IN ED: Medications - No data to display   IMPRESSION / MDM / ASSESSMENT AND PLAN / ED COURSE  I reviewed the triage vital signs and the nursing notes.                             67 year old male presents for evaluation of neck pain. Patient was hypertensive in triage, otherwise vital signs are stable. Patient NAD on exam.  Differential diagnosis includes, but is not limited to, cervical sprain, ligament strain, cervical radiculopathy, fracture.   Patient's presentation is most consistent with acute complicated illness / injury requiring diagnostic workup.  Ct cervical spine was negative. Physical exam is reassuring. I will start patient on meloxicam, he was instructed not to take any other NSAIDs while on this medication. He can continue to take tylenol. I also encouraged topical pain relievers as well as ice and heat. He  has a follow up appointment with workman's comp on Friday. He was given return precautions, he voiced understanding, all questions were answered and he was stable at discharge.   FINAL CLINICAL IMPRESSION(S) / ED DIAGNOSES   Final diagnoses:  Cervical sprain, initial encounter     Rx / DC Orders   ED Discharge Orders          Ordered    meloxicam (MOBIC) 15 MG tablet  Daily        05/31/23 2059             Note:  This document was prepared using Dragon voice recognition software and may include unintentional dictation errors.   Cameron Ali, PA-C 05/31/23 2103    Chesley Noon, MD 05/31/23 2259

## 2023-05-31 NOTE — ED Provider Triage Note (Signed)
Emergency Medicine Provider Triage Evaluation Note  Philip Tran , a 67 y.o. male  was evaluated in triage.  Pt complains of neck injury. Was holding onto a cable that snapped and it jerked his whole body, injuring his neck.  Workman's comp injury.  Review of Systems  Positive: Neck pain Negative:   Physical Exam  There were no vitals taken for this visit. Gen:   Awake, no distress   Resp:  Normal effort  MSK:   Moves extremities without difficulty  Other:    Medical Decision Making  Medically screening exam initiated at 6:12 PM.  Appropriate orders placed.  DAVARIAN LOHSE was informed that the remainder of the evaluation will be completed by another provider, this initial triage assessment does not replace that evaluation, and the importance of remaining in the ED until their evaluation is complete.     Cameron Ali, PA-C 05/31/23 1816

## 2023-05-31 NOTE — Discharge Instructions (Addendum)
Please take the meloxicam once a day for the next two weeks. While taking this medication do not take any other NSAIDs like ibuprofen, advil, aleve and naproxen. You can continue to take tylenol, 650 mg every 6 hours. I also recommend topical pain relievers like muscle creams, ice and heat. Try to do some gentle stretching and movement. I have attached some information about cervical sprain with some exercises for you to try.   Please follow up with workman's comp in a few days. If you have any worsening symptoms like numbness of the hands, weakness or inability to hold onto things please return to the ED.

## 2023-05-31 NOTE — ED Triage Notes (Signed)
Patient to ED via POV for neck injury. States he was holding onto cable when it broke causing his neck to jerk. Injury occurred Friday while working.

## 2023-06-23 ENCOUNTER — Ambulatory Visit: Payer: 59 | Admitting: Dermatology

## 2023-07-06 ENCOUNTER — Encounter: Payer: Self-pay | Admitting: Dermatology

## 2023-07-06 ENCOUNTER — Ambulatory Visit: Payer: Medicare HMO | Admitting: Dermatology

## 2023-07-06 DIAGNOSIS — D492 Neoplasm of unspecified behavior of bone, soft tissue, and skin: Secondary | ICD-10-CM

## 2023-07-06 DIAGNOSIS — L821 Other seborrheic keratosis: Secondary | ICD-10-CM

## 2023-07-06 DIAGNOSIS — C44212 Basal cell carcinoma of skin of right ear and external auricular canal: Secondary | ICD-10-CM

## 2023-07-06 DIAGNOSIS — L578 Other skin changes due to chronic exposure to nonionizing radiation: Secondary | ICD-10-CM

## 2023-07-06 DIAGNOSIS — Z1283 Encounter for screening for malignant neoplasm of skin: Secondary | ICD-10-CM

## 2023-07-06 DIAGNOSIS — L814 Other melanin hyperpigmentation: Secondary | ICD-10-CM

## 2023-07-06 DIAGNOSIS — D2271 Melanocytic nevi of right lower limb, including hip: Secondary | ICD-10-CM | POA: Diagnosis not present

## 2023-07-06 DIAGNOSIS — L720 Epidermal cyst: Secondary | ICD-10-CM | POA: Diagnosis not present

## 2023-07-06 DIAGNOSIS — W908XXA Exposure to other nonionizing radiation, initial encounter: Secondary | ICD-10-CM | POA: Diagnosis not present

## 2023-07-06 DIAGNOSIS — L820 Inflamed seborrheic keratosis: Secondary | ICD-10-CM | POA: Diagnosis not present

## 2023-07-06 DIAGNOSIS — Z85828 Personal history of other malignant neoplasm of skin: Secondary | ICD-10-CM

## 2023-07-06 DIAGNOSIS — L570 Actinic keratosis: Secondary | ICD-10-CM

## 2023-07-06 DIAGNOSIS — D2272 Melanocytic nevi of left lower limb, including hip: Secondary | ICD-10-CM

## 2023-07-06 DIAGNOSIS — L729 Follicular cyst of the skin and subcutaneous tissue, unspecified: Secondary | ICD-10-CM

## 2023-07-06 DIAGNOSIS — D1801 Hemangioma of skin and subcutaneous tissue: Secondary | ICD-10-CM

## 2023-07-06 DIAGNOSIS — D229 Melanocytic nevi, unspecified: Secondary | ICD-10-CM

## 2023-07-06 NOTE — Progress Notes (Signed)
Follow-Up Visit   Subjective  Philip Tran is a 68 y.o. male who presents for the following: Skin Cancer Screening and Full Body Skin Exam hx of BCC, Aks, check spot R leg  Patient accompanied by wife.  The patient presents for Total-Body Skin Exam (TBSE) for skin cancer screening and mole check. The patient has spots, moles and lesions to be evaluated, some may be new or changing and the patient may have concern these could be cancer.    The following portions of the chart were reviewed this encounter and updated as appropriate: medications, allergies, medical history  Review of Systems:  No other skin or systemic complaints except as noted in HPI or Assessment and Plan.  Objective  Well appearing patient in no apparent distress; mood and affect are within normal limits.  A full examination was performed including scalp, head, eyes, ears, nose, lips, neck, chest, axillae, abdomen, back, buttocks, bilateral upper extremities, bilateral lower extremities, hands, feet, fingers, toes, fingernails, and toenails. All findings within normal limits unless otherwise noted below.   Relevant physical exam findings are noted in the Assessment and Plan.  Right Ear helix 9.24mm pearly pink pap  L mid jaw x 1 Pink scaly macules R ant shoulder x 1, L upper abdomen x 1 (2) Stuck on waxy paps with erythema  Assessment & Plan   SKIN CANCER SCREENING PERFORMED TODAY.  ACTINIC DAMAGE - Chronic condition, secondary to cumulative UV/sun exposure - diffuse scaly erythematous macules with underlying dyspigmentation - Recommend daily broad spectrum sunscreen SPF 30+ to sun-exposed areas, reapply every 2 hours as needed.  - Staying in the shade or wearing long sleeves, sun glasses (UVA+UVB protection) and wide brim hats (4-inch brim around the entire circumference of the hat) are also recommended for sun protection.  - Call for new or changing lesions.  LENTIGINES, SEBORRHEIC KERATOSES,  HEMANGIOMAS - Benign normal skin lesions - Benign-appearing - Call for any changes - R leg, trunk, face  MELANOCYTIC NEVI - Tan-brown and/or pink-flesh-colored symmetric macules and papules - Benign appearing on exam today - Observation - Call clinic for new or changing moles - Recommend daily use of broad spectrum spf 30+ sunscreen to sun-exposed areas.  - feet  HISTORY OF BASAL CELL CARCINOMA OF THE SKIN - No evidence of recurrence today- R cheek - Recommend regular full body skin exams - Recommend daily broad spectrum sunscreen SPF 30+ to sun-exposed areas, reapply every 2 hours as needed.  - Call if any new or changing lesions are noted between office visits   EPIDERMAL INCLUSION CYST R upper eyebrow Exam: Subcutaneous nodule at R upper eyebrow  Benign-appearing. Exam most consistent with an epidermal inclusion cyst. Discussed that a cyst is a benign growth that can grow over time and sometimes get irritated or inflamed. Recommend observation if it is not bothersome. Discussed option of surgical excision to remove it if it is growing, symptomatic, or other changes noted. Please call for new or changing lesions so they can be evaluated.    NEOPLASM OF SKIN Right Ear helix Epidermal / dermal shaving  Lesion diameter (cm):  0.9 Informed consent: discussed and consent obtained   Patient was prepped and draped in usual sterile fashion: area prepped with alcohol. Anesthesia: the lesion was anesthetized in a standard fashion   Anesthetic:  1% lidocaine w/ epinephrine 1-100,000 buffered w/ 8.4% NaHCO3 Instrument used: flexible razor blade   Hemostasis achieved with: pressure, aluminum chloride and electrodesiccation   Outcome: patient tolerated  procedure well    Destruction of lesion  Destruction method: electrodesiccation and curettage   Informed consent: discussed and consent obtained   Curettage performed in three different directions: Yes   Electrodesiccation performed  over the curetted area: Yes   Final wound size (cm):  1.1 Hemostasis achieved with:  pressure, aluminum chloride and electrodesiccation Outcome: patient tolerated procedure well with no complications   Post-procedure details: wound care instructions given   Additional details:  Final size 1.1 x 0.8cm, Mupirocin ointment and Bandaid applied  Specimen 1 - Surgical pathology Differential Diagnosis: R/O BCC  Check Margins: yes 9.58mm pearly pink pap EDC today AK (ACTINIC KERATOSIS) L mid jaw x 1 Actinic keratoses are precancerous spots that appear secondary to cumulative UV radiation exposure/sun exposure over time. They are chronic with expected duration over 1 year. A portion of actinic keratoses will progress to squamous cell carcinoma of the skin. It is not possible to reliably predict which spots will progress to skin cancer and so treatment is recommended to prevent development of skin cancer.  Recommend daily broad spectrum sunscreen SPF 30+ to sun-exposed areas, reapply every 2 hours as needed.  Recommend staying in the shade or wearing long sleeves, sun glasses (UVA+UVB protection) and wide brim hats (4-inch brim around the entire circumference of the hat). Call for new or changing lesions. Destruction of lesion - L mid jaw x 1  Destruction method: cryotherapy   Informed consent: discussed and consent obtained   Lesion destroyed using liquid nitrogen: Yes   Region frozen until ice ball extended beyond lesion: Yes   Outcome: patient tolerated procedure well with no complications   Post-procedure details: wound care instructions given   Additional details:  Prior to procedure, discussed risks of blister formation, small wound, skin dyspigmentation, or rare scar following cryotherapy. Recommend Vaseline ointment to treated areas while healing.  INFLAMED SEBORRHEIC KERATOSIS (2) R ant shoulder x 1, L upper abdomen x 1 (2) Symptomatic, irritating, patient would like treated. Destruction  of lesion - R ant shoulder x 1, L upper abdomen x 1 (2)  Destruction method: cryotherapy   Informed consent: discussed and consent obtained   Lesion destroyed using liquid nitrogen: Yes   Region frozen until ice ball extended beyond lesion: Yes   Outcome: patient tolerated procedure well with no complications   Post-procedure details: wound care instructions given   Additional details:  Prior to procedure, discussed risks of blister formation, small wound, skin dyspigmentation, or rare scar following cryotherapy. Recommend Vaseline ointment to treated areas while healing.  Return in about 6 months (around 01/03/2024) for recheck bx and AK f/u.  I, Ardis Rowan, RMA, am acting as scribe for Willeen Niece, MD .   Documentation: I have reviewed the above documentation for accuracy and completeness, and I agree with the above.  Willeen Niece, MD

## 2023-07-06 NOTE — Patient Instructions (Addendum)

## 2023-07-08 LAB — SURGICAL PATHOLOGY

## 2023-07-11 ENCOUNTER — Telehealth: Payer: Self-pay

## 2023-07-11 NOTE — Telephone Encounter (Signed)
-----   Message from Willeen Niece sent at 07/11/2023 11:32 AM EST ----- 1. Skin, right ear helix :       BASAL CELL CARCINOMA, NODULAR PATTERN, CRUSTED, DEEP MARGIN INVOLVED   BCC skin cancer- already treated with EDC at time of biopsy   - please call patient

## 2023-07-11 NOTE — Telephone Encounter (Signed)
Advised patients's wife of bx results/sh

## 2023-07-25 ENCOUNTER — Encounter: Payer: 59 | Admitting: Gastroenterology

## 2023-08-04 ENCOUNTER — Ambulatory Visit: Payer: Medicare HMO

## 2023-08-04 VITALS — Ht 67.0 in | Wt 168.0 lb

## 2023-08-04 DIAGNOSIS — Z860100 Personal history of colon polyps, unspecified: Secondary | ICD-10-CM

## 2023-08-04 MED ORDER — SUFLAVE 178.7 G PO SOLR: 1.0000 | Freq: Once | ORAL | 0 refills | Status: AC

## 2023-08-04 NOTE — Progress Notes (Signed)

## 2023-08-11 ENCOUNTER — Other Ambulatory Visit: Payer: Self-pay | Admitting: Family Medicine

## 2023-08-11 NOTE — Telephone Encounter (Signed)
 Last office visit 03/07/2023 for CPE.  Last refilled 01/24/2023 for #30 with 5 refills.  Next Appt: No future appointments with PCP.

## 2023-08-19 ENCOUNTER — Encounter: Payer: Self-pay | Admitting: Gastroenterology

## 2023-08-29 ENCOUNTER — Encounter: Payer: Self-pay | Admitting: Gastroenterology

## 2023-08-29 ENCOUNTER — Encounter: Payer: 59 | Admitting: Gastroenterology

## 2023-08-29 ENCOUNTER — Ambulatory Visit (AMBULATORY_SURGERY_CENTER): Payer: 59 | Admitting: Gastroenterology

## 2023-08-29 VITALS — BP 128/74 | HR 62 | Temp 97.9°F | Resp 14 | Ht 67.0 in | Wt 168.0 lb

## 2023-08-29 DIAGNOSIS — Z860100 Personal history of colon polyps, unspecified: Secondary | ICD-10-CM

## 2023-08-29 DIAGNOSIS — K644 Residual hemorrhoidal skin tags: Secondary | ICD-10-CM | POA: Diagnosis not present

## 2023-08-29 DIAGNOSIS — K648 Other hemorrhoids: Secondary | ICD-10-CM

## 2023-08-29 DIAGNOSIS — K635 Polyp of colon: Secondary | ICD-10-CM

## 2023-08-29 DIAGNOSIS — Z860101 Personal history of adenomatous and serrated colon polyps: Secondary | ICD-10-CM | POA: Diagnosis not present

## 2023-08-29 DIAGNOSIS — K621 Rectal polyp: Secondary | ICD-10-CM

## 2023-08-29 DIAGNOSIS — Z1211 Encounter for screening for malignant neoplasm of colon: Secondary | ICD-10-CM | POA: Diagnosis not present

## 2023-08-29 DIAGNOSIS — K5730 Diverticulosis of large intestine without perforation or abscess without bleeding: Secondary | ICD-10-CM | POA: Diagnosis not present

## 2023-08-29 DIAGNOSIS — D128 Benign neoplasm of rectum: Secondary | ICD-10-CM

## 2023-08-29 DIAGNOSIS — D123 Benign neoplasm of transverse colon: Secondary | ICD-10-CM

## 2023-08-29 DIAGNOSIS — D120 Benign neoplasm of cecum: Secondary | ICD-10-CM

## 2023-08-29 MED ORDER — SODIUM CHLORIDE 0.9 % IV SOLN: 500.0000 mL | Freq: Once | INTRAVENOUS | Status: DC

## 2023-08-29 NOTE — Progress Notes (Signed)
 Called to room to assist during endoscopic procedure.  Patient ID and intended procedure confirmed with present staff. Received instructions for my participation in the procedure from the performing physician.

## 2023-08-29 NOTE — Progress Notes (Signed)
 [facility] History and Physical   Primary Care Physician:  [name], MD   Reason for Procedure:  History of adenomatous colon polyps  Plan:    Surveillance colonoscopy with possible interventions as needed     HPI: Philip Tran is a very pleasant 68 y.o. male here for surveillance colonoscopy. Denies any nausea, vomiting, abdominal pain, melena or bright red blood per rectum  The risks and benefits as well as alternatives of endoscopic procedure(s) have been discussed and reviewed. All questions answered. The patient agrees to proceed.    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 Authorizing Provider  Multiple Vitamin (MULTIVITAMIN) capsule Take 1 capsule by mouth daily.   Yes [provider]  cyclobenzaprine (FLEXERIL) 5 MG tablet Take 5 mg by mouth at bedtime as needed. 07/01/23   [provider]  gabapentin (NEURONTIN) 100 MG capsule Take 100 mg by mouth daily. 07/01/23   [provider]  hydrocortisone (ANUSOL-HC) 2.5 % rectal cream PLACE ONE APPLICATION RECTALLY TWICE DAILY 01/24/23   Copland, Karleen Hampshire, MD  meloxicam (MOBIC) 15 MG tablet Take 1 tablet by mouth daily. 08/11/23 02/07/24  [provider]  zolpidem  (AMBIEN) 10 MG tablet TAKE ONE TABLET BY MOUTH EVERY NIGHT AT BEDTIME AS NEEDED 08/12/23   Copland, Karleen Hampshire, MD    Current Outpatient Medications  Medication Sig Dispense Refill   Multiple Vitamin (MULTIVITAMIN) capsule Take 1 capsule by mouth daily.     cyclobenzaprine (FLEXERIL) 5 MG tablet Take 5 mg by mouth at bedtime as needed.     gabapentin (NEURONTIN) 100 MG capsule Take 100 mg by mouth daily.     hydrocortisone (ANUSOL-HC) 2.5 % rectal cream PLACE ONE APPLICATION RECTALLY TWICE DAILY 30 g 2   meloxicam (MOBIC) 15 MG tablet Take 1 tablet by mouth daily.     zolpidem (AMBIEN) 10 MG tablet TAKE ONE TABLET BY MOUTH EVERY NIGHT AT BEDTIME AS NEEDED 30 tablet 5   Current Facility-Administered Medications  Medication Dose Route Frequency Provider Last Rate Last Admin   0.9 %  sodium chloride infusion  500 mL Intravenous Once Napoleon Form, MD        Allergies as of 08/29/2023 - Review Complete 08/29/2023  Allergen Reaction Noted   Augmentin [amoxicillin-pot clavulanate] Diarrhea 07/06/2022   Codeine Nausea Only 09/16/2015    Family History  Problem Relation Age of Onset   Colon polyps Neg Hx    Colon cancer Neg Hx    Esophageal cancer Neg Hx    Prostate cancer Neg Hx    Rectal cancer Neg Hx    Stomach cancer Neg Hx     Social History  Socioeconomic History   Marital status: Married    Spouse name: Not on file   Number of children: Not on file   Years of education: Not on file   Highest education level: Not on file  Occupational History   Not on file  Tobacco Use   Smoking status: Former    Current packs/day: 0.00    Average packs/day: 2.0 packs/day for 20.0 years (40.0 ttl pk-yrs)    Types: Cigarettes    Start date: 07/07/1986    Quit date: 07/07/2006    Years since quitting: 17.1   Smokeless tobacco: Never  Vaping Use   Vaping status: Never Used  Substance and Sexual Activity   Alcohol use: Not Currently    Comment: 3 beers a week   Drug use: No    Sexual activity: Yes  Other Topics Concern   Not on file  Social History Narrative   Avid basketball player, golfer, softball player.   History of remote IV drug use, clean for many years now.   Social Drivers of Corporate investment banker Strain: Not on file  Food Insecurity: Not on file  Transportation Needs: Not on file  Physical Activity: Not on file  Stress: Not on file  Social Connections: Not on file  Intimate Partner Violence: Not on file    Review of Systems:  All other review of systems negative except as mentioned in the HPI.  Physical Exam: Vital signs in last 24 hours: BP 132/80   Pulse 70   Temp 97.9 F (36.6 C) (Temporal)   Ht 5\' 7"  (1.702 m)   Wt 168 lb (76.2 kg)   SpO2 95%   BMI 26.31 kg/m  General:   Alert, NAD Lungs:  Clear .   Heart:  Regular rate and rhythm Abdomen:  Soft, nontender and nondistended. Neuro/Psych:  Alert and cooperative. Normal mood and affect. A and O x 3  Reviewed labs, radiology imaging, old records and pertinent past GI work up  Patient is appropriate for planned procedure(s) and anesthesia in an ambulatory setting   K. Scherry Ran , MD [phone]

## 2023-08-29 NOTE — Progress Notes (Signed)
 Pt's states no medical or surgical changes since previsit or office visit.

## 2023-08-29 NOTE — Patient Instructions (Signed)
 Please read handouts provided. Continue present medications. Await pathology results. Resume previous diet. Repeat colonoscopy in 5 years for screening.   YOU HAD AN ENDOSCOPIC PROCEDURE TODAY AT THE [facility]:   Refer to the procedure report that was given to you for any specific questions about what was found during the examination.  If the procedure report does not answer your questions, please call your gastroenterologist to clarify.  If you requested that your care partner not be given the details of your procedure findings, then the procedure report has been included in a sealed envelope for you to review at your convenience later.  YOU SHOULD EXPECT: Some feelings of bloating in the abdomen. Passage of more gas than usual.  Walking can help get rid of the air that was put into your GI tract during the procedure and reduce the bloating. If you had a lower endoscopy (such as a colonoscopy or flexible sigmoidoscopy) you may notice spotting of blood in your stool or on the toilet paper. If you underwent a bowel prep for your procedure, you may not have a normal bowel movement for a few days.  Please Note:  You might notice some irritation and congestion in your nose or some drainage.  This is from the oxygen used during your procedure.  There is no need for concern and it should clear up in a day or so.  SYMPTOMS TO REPORT IMMEDIATELY:  Following lower endoscopy (colonoscopy or flexible sigmoidoscopy):  Excessive amounts of blood in the stool  Significant tenderness or worsening of abdominal pains  Swelling of the abdomen that is new, acute  Fever of 100F or higher.  For urgent or emergent issues, a gastroenterologist can be reached at any hour by calling [phone]. Do not use MyChart messaging for urgent concerns.    DIET:  We do recommend a small meal at first, but then you may proceed to your regular diet.  Drink plenty of fluids but you should avoid alcoholic  beverages for 24 hours.  ACTIVITY:  You should plan to take it easy for the rest of today and you should NOT DRIVE or use heavy machinery until tomorrow (because of the sedation medicines used during the test).    FOLLOW UP: Our staff will call the number listed on your records the next business day following your procedure.  We will call around 7:15- 8:00 am to check on you and address any questions or concerns that you may have regarding the information given to you following your procedure. If we do not reach you, we will leave a message.     If any biopsies were taken you will be contacted by phone or by letter within the next 1-3 weeks.  Please call us at [phone] if you have not heard about the biopsies in 3 weeks.    SIGNATURES/CONFIDENTIALITY: You and/or your care partner have signed paperwork which will be entered into your electronic medical record.  These signatures attest to the fact that that the information above on your After Visit Summary has been reviewed and is understood.  Full responsibility of the confidentiality of this discharge information lies with you and/or your care-partner.

## 2023-08-29 NOTE — Progress Notes (Signed)
To pacu, VSS. Report to Rn,tb

## 2023-08-29 NOTE — Op Note (Signed)
 [facility] Patient Name: Philip Tran Procedure Date: 08/29/2023 9:39 AM MRN: [id number] Endoscopist: Napoleon Form , MD, [id number] Age: 68 Referring MD:  Date of Birth: 1955/09/13 Gender: Male Account #: [id number] Procedure:                Colonoscopy Indications:              High risk colon cancer surveillance: Personal                            history of adenoma (10 mm or greater in size) Medicines:                Monitored Anesthesia Care Procedure:                Pre-Anesthesia Assessment:                           - Prior to the procedure, a History and Physical                            was performed, and patient medications and                            allergies were reviewed. The patient's tolerance of                            previous anesthesia was also reviewed. The risks                            and benefits of the procedure and the sedation                            options and risks were discussed with the patient.                            All questions were answered, and informed consent                            was obtained. Prior Anticoagulants: The patient has                            taken no anticoagulant or antiplatelet agents. ASA                            Grade Assessment: II - A patient with mild systemic                            disease. After reviewing the risks and benefits,                            the patient was deemed in satisfactory condition to                            undergo the procedure.  After obtaining informed consent, the colonoscope                            was passed under direct vision. Throughout the                            procedure, the patient's blood pressure, pulse, and                            oxygen saturations were monitored continuously. The                            Olympus Scope SN: [id number] was introduced through                            the anus and  advanced to the the cecum, identified                            by appendiceal orifice and ileocecal valve. The                            colonoscopy was performed without difficulty. The                            patient tolerated the procedure well. The quality                            of the bowel preparation was good. The ileocecal                            valve, appendiceal orifice, and rectum were                            photographed. Scope In: 9:49:05 AM Scope Out: 10:06:29 AM Scope Withdrawal Time: 0 hours 14 minutes 36 seconds  Total Procedure Duration: 0 hours 17 minutes 24 seconds  Findings:                 The perianal and digital rectal examinations were                            normal.                           A 1 mm polyp was found in the cecum. The polyp was                            sessile. The polyp was removed with a cold biopsy                            forceps. Resection and retrieval were complete.                           Two sessile polyps were found in the rectum and  transverse colon. The polyps were 3 to 4 mm in                            size. These polyps were removed with a cold snare.                            Resection and retrieval were complete.                           Scattered small-mouthed diverticula were found in                            the sigmoid colon and descending colon.                           Non-bleeding external and internal hemorrhoids were                            found during retroflexion. The hemorrhoids were                            small. Complications:            No immediate complications. Estimated Blood Loss:     Estimated blood loss was minimal. Impression:               - One 1 mm polyp in the cecum, removed with a cold                            biopsy forceps. Resected and retrieved.                           - Two 3 to 4 mm polyps in the rectum and in the                             transverse colon, removed with a cold snare.                            Resected and retrieved.                           - Diverticulosis in the sigmoid colon and in the                            descending colon.                           - Non-bleeding external and internal hemorrhoids. Recommendation:           - Patient has a contact number available for                            emergencies. The signs and symptoms of potential  delayed complications were discussed with the                            patient. Return to normal activities tomorrow.                            Written discharge instructions were provided to the                            patient.                           - Resume previous diet.                           - Continue present medications.                           - Await pathology results.                           - Repeat colonoscopy in 5 years for surveillance. Napoleon Form, MD 08/29/2023 10:14:57 AM This report has been signed electronically.

## 2023-08-30 ENCOUNTER — Telehealth: Payer: Self-pay | Admitting: *Deleted

## 2023-08-30 NOTE — Telephone Encounter (Signed)
  Follow up Call-     08/29/2023    8:41 AM  Call back number  Post procedure Call Back phone  # [phone]  Permission to leave phone message Yes     Patient questions:  Do you have a fever, pain , or abdominal swelling? No. Pain Score  0 *  Have you tolerated food without any problems? Yes.    Have you been able to return to your normal activities? Yes.    Do you have any questions about your discharge instructions: Diet   No. Medications  No. Follow up visit  No.  Do you have questions or concerns about your Care? No.  Actions: * If pain score is 4 or above: No action needed, pain <4.

## 2023-08-31 LAB — SURGICAL PATHOLOGY

## 2023-09-18 NOTE — Progress Notes (Unsigned)
     Philip Zollinger T. Jachob Mcclean, MD, CAQ Sports Medicine [facility] [address] [place], [address]  Phone: [phone]  FAX: [phone]  Philip Tran - 68 y.o. male  MRN [id number]  Date of Birth: [birth year]  Date: 09/19/2023  PCP: Philip Beat, MD  Referral: Philip Beat, MD  No chief complaint on file.  Subjective:   Philip Tran is a 68 y.o. very pleasant male patient with There is no height or weight on file to calculate BMI. who presents with the following:  Patient presents with an ongoing question of possible poor circulation.    Review of Systems is noted in the HPI, as appropriate  Objective:   There were no vitals taken for this visit.  GEN: No acute distress; alert,appropriate. PULM: Breathing comfortably in no respiratory distress PSYCH: Normally interactive.   Laboratory and Imaging Data:  Assessment and Plan:   ***

## 2023-09-19 ENCOUNTER — Ambulatory Visit (INDEPENDENT_AMBULATORY_CARE_PROVIDER_SITE_OTHER): Admitting: Family Medicine

## 2023-09-19 ENCOUNTER — Encounter: Payer: Self-pay | Admitting: Family Medicine

## 2023-09-19 VITALS — BP 142/80 | HR 83 | Temp 98.4°F | Ht 65.75 in | Wt 182.2 lb

## 2023-09-19 DIAGNOSIS — R202 Paresthesia of skin: Secondary | ICD-10-CM | POA: Diagnosis not present

## 2023-09-19 DIAGNOSIS — R200 Anesthesia of skin: Secondary | ICD-10-CM | POA: Diagnosis not present

## 2023-09-22 ENCOUNTER — Encounter: Payer: Self-pay | Admitting: Family Medicine

## 2023-10-20 DIAGNOSIS — S30861A Insect bite (nonvenomous) of abdominal wall, initial encounter: Secondary | ICD-10-CM | POA: Diagnosis not present

## 2023-10-20 DIAGNOSIS — W57XXXA Bitten or stung by nonvenomous insect and other nonvenomous arthropods, initial encounter: Secondary | ICD-10-CM | POA: Diagnosis not present

## 2023-10-21 DIAGNOSIS — W57XXXA Bitten or stung by nonvenomous insect and other nonvenomous arthropods, initial encounter: Secondary | ICD-10-CM | POA: Diagnosis not present

## 2023-10-21 DIAGNOSIS — S30861A Insect bite (nonvenomous) of abdominal wall, initial encounter: Secondary | ICD-10-CM | POA: Diagnosis not present

## 2023-10-24 ENCOUNTER — Encounter: Payer: Self-pay | Admitting: Gastroenterology

## 2023-10-27 ENCOUNTER — Ambulatory Visit: Admitting: Family Medicine

## 2023-11-08 NOTE — Progress Notes (Unsigned)
     Philip Sparkman [name], MD, CAQ Sports Medicine [facility] [address] [place], [address]  Phone: [phone]  FAX: [phone]  Philip Tran - 68 y.o. male  MRN [id number]  Date of Birth: 1956/04/14  Date: 11/10/2023  PCP: Scherrie Curt, MD  Referral: Scherrie Curt, MD  No chief complaint on file.  Subjective:   Philip Tran is a 68 y.o. very pleasant male patient with There is no height or weight on file to calculate BMI. who presents with the following:  [name] presents with an ongoing tick bite.    Review of Systems is noted in the HPI, as appropriate  Objective:   There were no vitals taken for this visit.  GEN: No acute distress; alert,appropriate. PULM: Breathing comfortably in no respiratory distress PSYCH: Normally interactive.   Laboratory and Imaging Data:  Assessment and Plan:   ***

## 2023-11-10 ENCOUNTER — Encounter: Payer: Self-pay | Admitting: Family Medicine

## 2023-11-10 ENCOUNTER — Ambulatory Visit (INDEPENDENT_AMBULATORY_CARE_PROVIDER_SITE_OTHER): Admitting: Family Medicine

## 2023-11-10 VITALS — BP 124/80 | HR 99 | Temp 98.2°F | Ht 65.75 in | Wt 183.2 lb

## 2023-11-10 DIAGNOSIS — S30861A Insect bite (nonvenomous) of abdominal wall, initial encounter: Secondary | ICD-10-CM

## 2023-11-10 DIAGNOSIS — L989 Disorder of the skin and subcutaneous tissue, unspecified: Secondary | ICD-10-CM | POA: Diagnosis not present

## 2023-11-10 DIAGNOSIS — W57XXXA Bitten or stung by nonvenomous insect and other nonvenomous arthropods, initial encounter: Secondary | ICD-10-CM | POA: Diagnosis not present

## 2023-11-25 ENCOUNTER — Encounter: Payer: Self-pay | Admitting: Family Medicine

## 2023-11-25 NOTE — Progress Notes (Signed)
 Attempted to contact patient x 3 to schedule Lower Vascular Ultrasound. Unable to reach, letter sent, order cancelled.

## 2023-12-05 DIAGNOSIS — M47812 Spondylosis without myelopathy or radiculopathy, cervical region: Secondary | ICD-10-CM | POA: Diagnosis not present

## 2023-12-18 NOTE — Progress Notes (Unsigned)
     Philip Ju T. Miliano Cotten, MD, CAQ Sports Medicine [facility] [address] [place], [address]  Phone: [phone]  FAX: [phone]  Philip Tran - 68 y.o. male  MRN [id number]  Date of Birth: Oct 05, 1955  Date: 12/19/2023  PCP: Watt Mirza, MD  Referral: Watt Mirza, MD  No chief complaint on file.  Subjective:   Philip Tran is a 68 y.o. very pleasant male patient with There is no height or weight on file to calculate BMI. who presents with the following:  He is a very pleasant patient who has a history of chronic hepatitis C as well as CLL, and he presents today with some ongoing ringing in his ears.    Review of Systems is noted in the HPI, as appropriate  Objective:   There were no vitals taken for this visit.  GEN: No acute distress; alert,appropriate. PULM: Breathing comfortably in no respiratory distress PSYCH: Normally interactive.   Laboratory and Imaging Data:  Assessment and Plan:   ***

## 2023-12-19 ENCOUNTER — Ambulatory Visit (INDEPENDENT_AMBULATORY_CARE_PROVIDER_SITE_OTHER): Admitting: Family Medicine

## 2023-12-19 VITALS — BP 140/80 | HR 81 | Temp 99.1°F | Ht 65.75 in | Wt 179.0 lb

## 2023-12-19 DIAGNOSIS — H9319 Tinnitus, unspecified ear: Secondary | ICD-10-CM | POA: Diagnosis not present

## 2023-12-20 ENCOUNTER — Encounter: Payer: Self-pay | Admitting: Family Medicine

## 2023-12-21 ENCOUNTER — Ambulatory Visit: Admitting: Family Medicine

## 2024-01-09 ENCOUNTER — Encounter: Payer: Self-pay | Admitting: Dermatology

## 2024-01-09 ENCOUNTER — Ambulatory Visit: Payer: Medicare HMO | Admitting: Dermatology

## 2024-01-09 DIAGNOSIS — L814 Other melanin hyperpigmentation: Secondary | ICD-10-CM

## 2024-01-09 DIAGNOSIS — L821 Other seborrheic keratosis: Secondary | ICD-10-CM | POA: Diagnosis not present

## 2024-01-09 DIAGNOSIS — W908XXD Exposure to other nonionizing radiation, subsequent encounter: Secondary | ICD-10-CM | POA: Diagnosis not present

## 2024-01-09 DIAGNOSIS — L720 Epidermal cyst: Secondary | ICD-10-CM

## 2024-01-09 DIAGNOSIS — L578 Other skin changes due to chronic exposure to nonionizing radiation: Secondary | ICD-10-CM

## 2024-01-09 DIAGNOSIS — Z85828 Personal history of other malignant neoplasm of skin: Secondary | ICD-10-CM | POA: Diagnosis not present

## 2024-01-09 DIAGNOSIS — L820 Inflamed seborrheic keratosis: Secondary | ICD-10-CM | POA: Diagnosis not present

## 2024-01-09 DIAGNOSIS — L729 Follicular cyst of the skin and subcutaneous tissue, unspecified: Secondary | ICD-10-CM

## 2024-01-09 DIAGNOSIS — Z872 Personal history of diseases of the skin and subcutaneous tissue: Secondary | ICD-10-CM | POA: Diagnosis not present

## 2024-01-09 NOTE — Patient Instructions (Addendum)

## 2024-01-09 NOTE — Progress Notes (Signed)
 Follow-Up Visit   Subjective  Philip Tran is a 68 y.o. male who presents for the following: recheck AK L mid jaw, 81m f/u, hx of BCC R ear helix, bx and EDC 07/06/23  The patient has spots, moles and lesions to be evaluated, some may be new or changing and the patient may have concern these could be cancer.   The following portions of the chart were reviewed this encounter and updated as appropriate: medications, allergies, medical history  Review of Systems:  No other skin or systemic complaints except as noted in HPI or Assessment and Plan.  Objective  Well appearing patient in no apparent distress; mood and affect are within normal limits.  A focused examination was performed of the following areas: Face, ears, arms, back  Relevant exam findings are noted in the Assessment and Plan.  L lower neck x 1 Stuck on waxy paps with erythema  Assessment & Plan   HISTORY OF BASAL CELL CARCINOMA OF THE SKIN - No evidence of recurrence today - Recommend regular full body skin exams - Recommend daily broad spectrum sunscreen SPF 30+ to sun-exposed areas, reapply every 2 hours as needed.  - Call if any new or changing lesions are noted between office visits  - R ear helix  HISTORY OF PRECANCEROUS ACTINIC KERATOSIS - site(s) of PreCancerous Actinic Keratosis clear today.- L mid jaw clear - these may recur and new lesions may form requiring treatment to prevent transformation into skin cancer - observe for new or changing spots and contact [facility] for appointment if occur - photoprotection with sun protective clothing; sunglasses and broad spectrum sunscreen with SPF of at least 30 + and frequent self skin exams recommended - yearly exams by a dermatologist recommended for persons with history of PreCancerous Actinic Keratoses    ACTINIC DAMAGE Face, chest - chronic, secondary to cumulative UV radiation exposure/sun exposure over time - diffuse scaly erythematous  macules with underlying dyspigmentation - Recommend daily broad spectrum sunscreen SPF 30+ to sun-exposed areas, reapply every 2 hours as needed.  - Recommend staying in the shade or wearing long sleeves, sun glasses (UVA+UVB protection) and wide brim hats (4-inch brim around the entire circumference of the hat). - Call for new or changing lesions.   SEBORRHEIC KERATOSIS Face, arms, back, chest, scalp, L temporal scalp - Stuck-on, waxy, tan-brown papules  - Benign-appearing - Discussed benign etiology and prognosis. - Observe - Call for any changes  EPIDERMAL INCLUSION CYST R upper eyebrow Exam: Subcutaneous nodule at R upper eyebrow  Benign-appearing. Exam most consistent with an epidermal inclusion cyst. Discussed that a cyst is a benign growth that can grow over time and sometimes get irritated or inflamed. Recommend observation if it is not bothersome. Discussed option of surgical excision to remove it if it is growing, symptomatic, or other changes noted. Please call for new or changing lesions so they can be evaluated.  LENTIGINES back Exam: scattered tan macules Due to sun exposure Treatment Plan: Benign-appearing, observe. Recommend daily broad spectrum sunscreen SPF 30+ to sun-exposed areas, reapply every 2 hours as needed.  Call for any changes  SEBORRHEIC KERATOSIS vs NEVUS R temporal scalp Exam: 5.38mm flesh flat pap  Treatment Plan: Benign-appearing.  Observation.  Call clinic for new or changing moles.  Recommend daily use of broad spectrum spf 30+ sunscreen to sun-exposed areas.    INFLAMED SEBORRHEIC KERATOSIS L lower neck x 1 Symptomatic, irritating, patient would like treated. Destruction of lesion - L  lower neck x 1  Destruction method: cryotherapy   Informed consent: discussed and consent obtained   Lesion destroyed using liquid nitrogen: Yes   Region frozen until ice ball extended beyond lesion: Yes   Outcome: patient tolerated procedure well with no  complications   Post-procedure details: wound care instructions given   Additional details:  Prior to procedure, discussed risks of blister formation, small wound, skin dyspigmentation, or rare scar following cryotherapy. Recommend Vaseline ointment to treated areas while healing.    Return in about 6 months (around 07/11/2024) for UBSE, Hx of BCC, Hx of AKs.  I, Grayce Saunas, RMA, am acting as scribe for Rexene Rattler, MD .   Documentation: I have reviewed the above documentation for accuracy and completeness, and I agree with the above.  Rexene Rattler, MD

## 2024-02-09 ENCOUNTER — Other Ambulatory Visit: Payer: Self-pay | Admitting: Family Medicine

## 2024-02-09 NOTE — Telephone Encounter (Signed)
 Last office visit 12/19/23 for Tinnitus.  Last refilled 08/12/23 for #30 with 5 refills.  Next Appt: No future appointments with PCP.

## 2024-05-03 DIAGNOSIS — H35361 Drusen (degenerative) of macula, right eye: Secondary | ICD-10-CM | POA: Diagnosis not present

## 2024-05-03 DIAGNOSIS — H52223 Regular astigmatism, bilateral: Secondary | ICD-10-CM | POA: Diagnosis not present

## 2024-05-03 DIAGNOSIS — H524 Presbyopia: Secondary | ICD-10-CM | POA: Diagnosis not present

## 2024-05-03 DIAGNOSIS — H2513 Age-related nuclear cataract, bilateral: Secondary | ICD-10-CM | POA: Diagnosis not present

## 2024-06-17 NOTE — Progress Notes (Unsigned)
 "    Philip Racette T. Philip Bilotta, MD, CAQ Sports Medicine [facility] [address] [place], [address]  Phone: [phone]  FAX: [phone]  Philip Tran - 69 y.o. male  MRN [id number]  Date of Birth: 1956-04-08  Date: 06/18/2024  PCP: Watt Mirza, MD  Referral: Watt Mirza, MD  No chief complaint on file.  Patient Care Team: Watt Mirza, MD as PCP - General Subjective:   Philip Tran is a 69 y.o. pleasant patient who presents for a medicare wellness examination:  Preventative Health Maintenance Visit:  Health Maintenance Summary Reviewed and updated, unless pt declines services.  Tobacco History Reviewed. Alcohol: No concerns, no excessive use Exercise Habits: Some activity, rec at least 30 mins 5 times a week STD concerns: no risk or activity to increase risk Drug Use: None  Prevnar 20 Shingrix Tdap Flu  He does have a history of stable CLL and previously treated hepatitis C.  Health Maintenance  Topic Date Due   Medicare Annual Wellness (AWV)  Never done   Pneumococcal Vaccine: 50+ Years (1 of 2 - PCV) Never done   Zoster Vaccines- Shingrix (1 of 2) Never done   COVID-19 Vaccine (3 - Moderna risk series) 10/31/2019   DTaP/Tdap/Td (2 - Td or Tdap) 05/06/2021   Influenza Vaccine  01/13/2024   Colonoscopy  08/28/2028   Hepatitis C Screening  Completed   Meningococcal B Vaccine  Aged Out    Discussed the use of AI scribe software for clinical note transcription with the patient, who gave verbal consent to proceed.  History of Present Illness     Immunization History  Administered Date(s) Administered   Hepatitis A, Adult 09/03/2010, 08/24/2011   Influenza Split 03/03/2011   Influenza Whole 04/12/2008, 03/12/2009   Influenza, Seasonal, Injecte, Preservative Fre 03/13/2012   Influenza,inj,Quad PF,6+ Mos 01/31/2014, 03/06/2015, 03/05/2016, 04/12/2017, 03/02/2018, 03/06/2019   Influenza,inj,quad, With  Preservative 03/23/2013   Moderna Sars-Covid-2 Vaccination 09/05/2019, 10/03/2019   Tdap 05/07/2011    Patient Active Problem List   Diagnosis Date Noted   CLL (chronic lymphocytic leukemia) (HCC) 03/03/2011    Priority: High   Chronic hepatitis C virus infection (HCC) 12/10/2009    Priority: High   Rheumatoid arthritis (HCC) 03/05/2011    Priority: Medium    History of total right hip arthroplasty 02/01/2014   Primary osteoarthritis of left hip 02/01/2014    Past Medical History:  Diagnosis Date   Actinic keratosis    BCC (basal cell carcinoma) 02/09/2022   right cheek, Mohs 05/17/22   BCC (basal cell carcinoma) 07/06/2023   Right Ear helix, [facility]   CLL (chronic lymphoblastic leukemia) 01/12/2010   Dr. 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   History of remote IV drug use, clean for many years now.    Past Medical History, Surgical History, Social History, Family History, Problem List, Medications, and Allergies have been reviewed and updated if relevant.  Review of Systems: Pertinent positives are listed above.  Otherwise, a full 14 point review of systems has been done in full and it is negative except where it is noted positive.  Objective:   There were no vitals taken for this visit.     03/28/2022   12:17 PM 04/07/2022    3:03 PM 07/05/2022    3:42 PM 09/19/2023    3:41 PM 11/10/2023    9:38 AM  Fall Risk  Falls in the past year?   0 0 0  Was there an injury with Fall?   0  0  0   Fall Risk Category Calculator   0 0 0  (RETIRED) Patient Fall Risk Level Low fall risk  Low fall risk      Patient at Risk for Falls Due to   No Fall Risks No Fall Risks No Fall Risks  Fall risk Follow up   Falls evaluation completed  Falls evaluation completed Falls evaluation completed     Data saved with a previous flowsheet row definition   Ideal Body Weight:   No results found.    11/10/2023    9:38 AM 07/05/2022    3:42 PM 02/18/2022    2:50 PM 08/15/2019    2:52 PM 04/21/2017   12:10 PM  Depression screen PHQ 2/9  Decreased Interest 0 0 0 0 0  Down, Depressed, Hopeless 0 0 0 0 0  PHQ - 2 Score 0 0 0 0 0     GEN: well developed, well nourished, no acute distress Eyes: conjunctiva and lids normal, PERRLA, EOMI ENT: TM clear, nares clear, oral exam WNL Neck: supple, no lymphadenopathy, no thyromegaly, no JVD Pulm: clear to auscultation and percussion, respiratory effort normal CV: regular rate and rhythm, S1-S2, no murmur, rub or gallop, no bruits, peripheral pulses normal and symmetric, no cyanosis, clubbing, edema or varicosities GI: soft, non-tender; no hepatosplenomegaly, masses; active bowel sounds all quadrants GU: deferred Lymph: no cervical, axillary or inguinal adenopathy MSK: gait normal, muscle tone and strength WNL, no joint swelling, effusions, discoloration, crepitus  SKIN: clear, good turgor, color WNL, no rashes, lesions, or ulcerations Neuro: normal mental status, normal strength, sensation, and motion Psych: alert; oriented to person, place and time, normally interactive and not anxious or depressed in appearance.  All labs reviewed with patient.  Lab Review:     Latest Ref Rng & Units 03/07/2023    4:17 PM 04/07/2022    3:41 PM 03/28/2022   12:21 PM  CBC  EXTENDED  WBC 4.0 - 10.5 K/uL 14.7  18.3  15.0   RBC 4.22 - 5.81 Mil/uL 5.55  5.07  5.72   Hemoglobin 13.0 - 17.0 g/dL 84.9  85.7  84.5   HCT 39.0 - 52.0 % 46.3  42.9  48.4   Platelets 150.0 - 400.0 K/uL 235.0  294  263   NEUT# 1.4 - 7.7 K/uL 4.7  4.4  5.1   Lymph# 0.7 - 4.0 K/uL 9.4  11.6  8.1        Latest Ref Rng & Units 03/07/2023    4:17 PM 04/07/2022    3:41 PM 03/28/2022   12:21 PM  BMP  Glucose 70 - 99 mg/dL 90  96  874   BUN 6 - 23 mg/dL 13  16  19    Creatinine 0.40 - 1.50 mg/dL 8.98  8.92  8.75   Sodium 135 - 145 mEq/L 143  141  140   Potassium 3.5 - 5.1 mEq/L 4.2  4.6  5.3   Chloride 96 - 112 mEq/L 105  106  107   CO2 19 - 32 mEq/L 29  27  29    Calcium 8.4 - 10.5 mg/dL 9.9  9.0  9.0        Latest Ref Rng & Units 03/07/2023    4:17 PM 04/07/2022    3:41 PM 03/28/2022   12:21 PM  Hepatic Function  Total Protein 6.0 - 8.3 g/dL 6.5  6.5  6.8   Albumin 3.5 - 5.2 g/dL 4.5  3.9  4.1   AST 0 - 37 U/L 25  44  31   ALT 0 - 53 U/L 22  71  46   Alk Phosphatase 39 - 117 U/L 92  71  87   Total Bilirubin 0.2 - 1.2 mg/dL 0.5  <9.8  0.5   Bilirubin, Direct 0.0 - 0.3 mg/dL 0.1       Lab Results  Component Value Date   CHOL 221 (H) 03/07/2023   Lab Results  Component Value Date   HDL 34.60 (L) 03/07/2023   Lab Results  Component Value Date   LDLCALC 155 (H) 03/07/2023   Lab Results  Component Value Date   TRIG 157.0 (H) 03/07/2023   Lab Results  Component Value Date   CHOLHDL 6 03/07/2023   No results for input(s): PSA in the last 72 hours. No results found for: HCVAB No results found for: [facility]   Lab Results  Component Value Date   HGBA1C 5.8 09/04/2020   Lab Results  Component Value Date   LDLCALC 155 (H) 03/07/2023   CREATININE 1.01 03/07/2023       Assessment and Plan:     ICD-10-CM   1. Healthcare maintenance  Z00.00       Assessment and Plan Assessment & Plan      Health Maintenance Exam: The patient's preventative  maintenance and recommended screening tests for an annual wellness exam were reviewed in full today. Brought up to date unless services declined.  Counselled on the importance of diet, exercise, and its role in overall health and mortality. The patient's FH and SH was reviewed, including their home life, tobacco status, and drug and alcohol status.  Follow-up in 1 year for physical exam or additional follow-up below.  I have personally reviewed the Medicare Annual Wellness questionnaire and have noted 1. The patient's medical and social history 2. Their use of alcohol, tobacco or illicit drugs 3. Their current medications and supplements 4. The patient's functional ability including ADL's, fall risks, home safety risks and hearing or visual             impairment. 5. Diet and physical activities 6. Evidence for depression or mood disorders 7. Reviewed Updated provider list, see scanned forms and [facility] Snapshot.  8. Reviewed whether or not the patient has HCPOA or living will, and discussed what this means with the patient.  Recommended he bring in a copy for his chart in [facility].  The patients weight, height, BMI and visual acuity have been recorded in the chart I have made referrals, counseling and provided education to the patient based review of  the above and I have provided the pt with a written personalized care plan for preventive services.  I have provided the patient with a copy of your personalized plan for preventive services. Instructed to take the time to review along with their updated medication list.  Disposition: No follow-ups on file.  Future Appointments  Date Time Provider Department Center  06/18/2024  2:00 PM Watt Mirza, MD [facility] [address]  07/10/2024  3:15 PM Jackquline Sawyer, MD [facility] None  07/27/2024  2:20 PM [facility] ANNUAL WELLNESS VISIT 2 [facility] [address]    No orders of the defined types were placed in this encounter.  There are no discontinued  medications. No orders of the defined types were placed in this encounter.   Signed,  Mirza DASEN. Kyira Volkert, MD   Allergies as of 06/18/2024       Reactions   Augmentin  [amoxicillin -pot Clavulanate] Diarrhea   Codeine Nausea Only        Medication List        Accurate as of June 17, 2024  9:38 AM. If you have any questions, ask your nurse or doctor.          cyclobenzaprine 5 MG tablet Commonly known as: FLEXERIL Take 5 mg by mouth at bedtime as needed.   hydrocortisone  2.5 % rectal cream Commonly known as: ANUSOL -HC PLACE ONE APPLICATION RECTALLY TWICE DAILY   multivitamin capsule Take 1 capsule by mouth daily.   zolpidem  10 MG tablet Commonly known as: AMBIEN  TAKE ONE TABLET BY MOUTH EVERY NIGHT AT BEDTIME AS NEEDED        "

## 2024-06-18 ENCOUNTER — Ambulatory Visit (INDEPENDENT_AMBULATORY_CARE_PROVIDER_SITE_OTHER): Admitting: Family Medicine

## 2024-06-18 ENCOUNTER — Encounter: Payer: Self-pay | Admitting: Family Medicine

## 2024-06-18 VITALS — BP 140/80 | HR 95 | Temp 98.4°F | Ht 65.5 in | Wt 175.4 lb

## 2024-06-18 DIAGNOSIS — Z79899 Other long term (current) drug therapy: Secondary | ICD-10-CM | POA: Diagnosis not present

## 2024-06-18 DIAGNOSIS — I10 Essential (primary) hypertension: Secondary | ICD-10-CM | POA: Diagnosis not present

## 2024-06-18 DIAGNOSIS — C9110 Chronic lymphocytic leukemia of B-cell type not having achieved remission: Secondary | ICD-10-CM | POA: Diagnosis not present

## 2024-06-18 DIAGNOSIS — D72820 Lymphocytosis (symptomatic): Secondary | ICD-10-CM

## 2024-06-18 DIAGNOSIS — Z125 Encounter for screening for malignant neoplasm of prostate: Secondary | ICD-10-CM

## 2024-06-18 DIAGNOSIS — Z13220 Encounter for screening for lipoid disorders: Secondary | ICD-10-CM

## 2024-06-18 DIAGNOSIS — Z0000 Encounter for general adult medical examination without abnormal findings: Secondary | ICD-10-CM

## 2024-06-18 NOTE — Patient Instructions (Addendum)
 Tdap booster - tetanus

## 2024-06-19 ENCOUNTER — Telehealth: Payer: Self-pay | Admitting: Family Medicine

## 2024-06-19 ENCOUNTER — Ambulatory Visit: Payer: Self-pay | Admitting: Family Medicine

## 2024-06-19 LAB — CBC WITH DIFFERENTIAL/PLATELET
Basophils Absolute: 0 K/uL (ref 0.0–0.1)
Basophils Relative: 0 % (ref 0.0–3.0)
Eosinophils Absolute: 0.5 K/uL (ref 0.0–0.7)
Eosinophils Relative: 2.2 % (ref 0.0–5.0)
HCT: 44.8 % (ref 39.0–52.0)
Hemoglobin: 14.8 g/dL (ref 13.0–17.0)
Lymphocytes Relative: 79.6 % — ABNORMAL HIGH (ref 12.0–46.0)
Lymphs Abs: 18.2 K/uL — ABNORMAL HIGH (ref 0.7–4.0)
MCHC: 33.1 g/dL (ref 30.0–36.0)
MCV: 84 fl (ref 78.0–100.0)
Monocytes Absolute: 0.2 K/uL (ref 0.1–1.0)
Monocytes Relative: 0.7 % — ABNORMAL LOW (ref 3.0–12.0)
Neutro Abs: 4 K/uL (ref 1.4–7.7)
Neutrophils Relative %: 17.5 % — ABNORMAL LOW (ref 43.0–77.0)
Platelets: 299 K/uL (ref 150.0–400.0)
RBC: 5.33 Mil/uL (ref 4.22–5.81)
RDW: 15.9 % — ABNORMAL HIGH (ref 11.5–15.5)
WBC: 22.8 K/uL (ref 4.0–10.5)

## 2024-06-19 LAB — BASIC METABOLIC PANEL WITH GFR
BUN: 14 mg/dL (ref 6–23)
CO2: 31 meq/L (ref 19–32)
Calcium: 9.8 mg/dL (ref 8.4–10.5)
Chloride: 105 meq/L (ref 96–112)
Creatinine, Ser: 1.03 mg/dL (ref 0.40–1.50)
GFR: 75.8 mL/min
Glucose, Bld: 88 mg/dL (ref 70–99)
Potassium: 4.9 meq/L (ref 3.5–5.1)
Sodium: 141 meq/L (ref 135–145)

## 2024-06-19 LAB — HEPATIC FUNCTION PANEL
ALT: 28 U/L (ref 3–53)
AST: 26 U/L (ref 5–37)
Albumin: 4.8 g/dL (ref 3.5–5.2)
Alkaline Phosphatase: 88 U/L (ref 39–117)
Bilirubin, Direct: 0.1 mg/dL (ref 0.1–0.3)
Total Bilirubin: 0.4 mg/dL (ref 0.2–1.2)
Total Protein: 6.7 g/dL (ref 6.0–8.3)

## 2024-06-19 LAB — LIPID PANEL
Cholesterol: 255 mg/dL — ABNORMAL HIGH (ref 28–200)
HDL: 33.1 mg/dL — ABNORMAL LOW
LDL Cholesterol: 164 mg/dL — ABNORMAL HIGH (ref 10–99)
NonHDL: 221.83
Total CHOL/HDL Ratio: 8
Triglycerides: 291 mg/dL — ABNORMAL HIGH (ref 10.0–149.0)
VLDL: 58.2 mg/dL — ABNORMAL HIGH (ref 0.0–40.0)

## 2024-06-19 NOTE — Telephone Encounter (Signed)
 Received call from [place] at [place] [facility] with critical WBC of 22.8 k/uL  Dr. Watt notified of results at 9:34 am.

## 2024-06-19 NOTE — Addendum Note (Signed)
 Addended by: WATT MIRZA on: 06/19/2024 10:56 AM   Modules accepted: Orders

## 2024-06-20 LAB — PSA, TOTAL WITH REFLEX TO PSA, FREE: PSA, Total: 1.5 ng/mL

## 2024-06-25 ENCOUNTER — Other Ambulatory Visit: Payer: Self-pay | Admitting: Physician Assistant

## 2024-06-25 DIAGNOSIS — C9110 Chronic lymphocytic leukemia of B-cell type not having achieved remission: Secondary | ICD-10-CM

## 2024-06-25 NOTE — Progress Notes (Signed)
 "  [facility] [phone]   [phone]  PROGRESS NOTE:  Patient Care Team: Watt Mirza, MD as PCP - General  CHIEF COMPLAINTS/PURPOSE OF CONSULTATION:  Chronic Lymphocytic Leukemia  HISTORY OF PRESENTING ILLNESS:  Philip Tran 69 y.o. male with medical history significant for CLL Rai Stage 0.  On exam today, Philip Tran reports his energy and appetite are overall stable. He is able to complete his ADLs on his own. He denies nausea, vomiting or bowel habit changes. He denies any palpable masses. He denies B symptoms including fevers, night sweats or weight loss. He denies easy bruising or overt signs of bleeding. He has no other complaints. Rest of the ROS is below.   MEDICAL HISTORY:  Past Medical History:  Diagnosis Date   Actinic keratosis    BCC (basal cell carcinoma) 02/09/2022   right cheek, Mohs 05/17/22   BCC (basal cell carcinoma) 07/06/2023   Right Ear helix, [facility]   CLL (chronic lymphoblastic leukemia) 01/12/2010   Dr. 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   Rheumatoid arthritis (HCC)     SURGICAL HISTORY: Past Surgical History:  Procedure Laterality Date   COLONOSCOPY  2019   HERNIA REPAIR     TOTAL HIP ARTHROPLASTY Right 06/14/2012   [facility]    SOCIAL HISTORY: Social History   Socioeconomic History   Marital status: Married    Spouse name: Not on file   Number of children: Not on file   Years of education: Not on file   Highest education level: Not on file  Occupational History   Not on file  Tobacco Use   Smoking status: Former    Current packs/day: 0.00    Average packs/day: 2.0 packs/day for 20.0 years (40.0 ttl pk-yrs)    Types: Cigarettes    Start date: 07/07/1986    Quit date: 07/07/2006     Years since quitting: 17.9   Smokeless tobacco: Never  Vaping Use   Vaping status: Never Used  Substance and Sexual Activity   Alcohol use: Not Currently    Comment: 3 beers a week   Drug use: No   Sexual activity: Yes  Other Topics Concern   Not on file  Social History Narrative   Avid basketball player, golfer, softball player.   History of remote IV drug use, clean for many years now.   Social Drivers of Health   Tobacco Use: Medium Risk (06/18/2024)   Patient History    Smoking Tobacco Use: Former    Smokeless Tobacco Use: Never    Passive Exposure: Not on Actuary Strain: Not on file  Food Insecurity: Not on file  Transportation Needs: Not on file  Physical Activity: Not on file  Stress: Not on file  Social Connections: Not on file  Intimate Partner Violence: Not on file  Depression (PHQ2-9): Low Risk (06/18/2024)   Depression (PHQ2-9)    PHQ-2 Score: 0  Alcohol Screen: Not on file  Housing: Not on file  Utilities: Not on file  Health Literacy: Not on file    FAMILY HISTORY: Family History  Problem Relation Age of Onset   Colon polyps Neg Hx    Colon cancer Neg Hx    Esophageal cancer  Neg Hx    Prostate cancer Neg Hx    Rectal cancer Neg Hx    Stomach cancer Neg Hx     ALLERGIES:  is allergic to augmentin  [amoxicillin -pot clavulanate] and codeine.  MEDICATIONS:  Current Outpatient Medications  Medication Sig Dispense Refill   hydrocortisone  (ANUSOL -HC) 2.5 % rectal cream PLACE ONE APPLICATION RECTALLY TWICE DAILY 30 g 2   Multiple Vitamin (MULTIVITAMIN) capsule Take 1 capsule by mouth daily.     tiZANidine (ZANAFLEX) 4 MG tablet Take 4 mg by mouth 2 (two) times daily as needed for muscle spasms.     zolpidem  (AMBIEN ) 10 MG tablet TAKE ONE TABLET BY MOUTH EVERY NIGHT AT BEDTIME AS NEEDED 30 tablet 5   No current facility-administered medications for this visit.    REVIEW OF SYSTEMS:   Constitutional: ( - ) fevers, ( - )  chills , ( - )  night sweats Eyes: ( - ) blurriness of vision, ( - ) double vision, ( - ) watery eyes Ears, nose, mouth, throat, and face: ( - ) mucositis, ( - ) sore throat Respiratory: ( - ) cough, ( - ) dyspnea, ( - ) wheezes Cardiovascular: ( - ) palpitation, ( - ) chest discomfort, ( - ) lower extremity swelling Gastrointestinal:  ( - ) nausea, ( - ) heartburn, ( - ) change in bowel habits Skin: ( - ) abnormal skin rashes Lymphatics: ( - ) new lymphadenopathy, ( - ) easy bruising Neurological: ( - ) numbness, ( - ) tingling, ( - ) new weaknesses Behavioral/Psych: ( - ) mood change, ( - ) new changes  All other systems were reviewed with the patient and are negative.  PHYSICAL EXAMINATION: ECOG PERFORMANCE STATUS: 1 - Symptomatic but completely ambulatory  Vitals:   06/26/24 1025  BP: 139/89  Pulse: 77  Resp: 18  Temp: 97.9 F (36.6 C)  SpO2: 97%    Filed Weights   06/26/24 1025  Weight: 181 lb 8 oz (82.3 kg)     GENERAL: well appearing male in NAD  SKIN: skin color, texture, turgor are normal, no rashes or significant lesions EYES: conjunctiva are pink and non-injected, sclera clear OROPHARYNX: no exudate, no erythema; lips, buccal mucosa, and tongue normal  NECK: supple, non-tender LYMPH:  no palpable lymphadenopathy in the cervical, axillary or supraclavicular lymph nodes.  LUNGS: clear to auscultation and percussion with normal breathing effort HEART: regular rate & rhythm and no murmurs and no lower extremity edema ABDOMEN: soft, non-tender, non-distended, normal bowel sounds Musculoskeletal: no cyanosis of digits and no clubbing  PSYCH: alert & oriented x 3, fluent speech NEURO: no focal motor/sensory deficits  LABORATORY DATA:  I have reviewed the data as listed    Latest Ref Rng & Units 06/18/2024    2:46 PM 03/07/2023    4:17 PM 04/07/2022    3:41 PM  CBC  WBC 4.0 - 10.5 K/uL 22.8 Repeated and verified X2.  14.7  18.3   Hemoglobin 13.0 - 17.0 g/dL 85.1  84.9  85.7    Hematocrit 39.0 - 52.0 % 44.8  46.3  42.9   Platelets 150.0 - 400.0 K/uL 299.0  235.0  294        Latest Ref Rng & Units 06/18/2024    2:46 PM 03/07/2023    4:17 PM 04/07/2022    3:41 PM  CMP  Glucose 70 - 99 mg/dL 88  90  96   BUN 6 - 23 mg/dL 14  13  16   Creatinine 0.40 - 1.50 mg/dL 8.96  8.98  8.92   Sodium 135 - 145 mEq/L 141  143  141   Potassium 3.5 - 5.1 mEq/L 4.9  4.2  4.6   Chloride 96 - 112 mEq/L 105  105  106   CO2 19 - 32 mEq/L 31  29  27    Calcium 8.4 - 10.5 mg/dL 9.8  9.9  9.0   Total Protein 6.0 - 8.3 g/dL 6.7  6.5  6.5   Total Bilirubin 0.2 - 1.2 mg/dL 0.4  0.5  <9.8   Alkaline Phos 39 - 117 U/L 88  92  71   AST 5 - 37 U/L 26  25  44   ALT 3 - 53 U/L 28  22  71      RADIOGRAPHIC STUDIES: I have personally reviewed the radiological images as listed and agreed with the findings in the report. No results found.  ASSESSMENT & PLAN Philip Tran is a 69 y.o. male who presents to the clinic for continued management of CLL.    #CLL, Stage Rai 0: --Diagnosed in July 2011 and most recently managed by [facility]. He was last evaluated in April 2017. --Prognostic panel including FISH panel showed three chromosome 12 signals in 34% of cells examined.  --Patient denies any B symptoms --Labs from 06/18/2024 showed WBC 22.8, Hgb 14.8, Plt 299, ALC 18.2.  --Recommend to continue to monitor. He does not meet criteria to initiate treatment.  --RTC in 6 months with repeat labs   #H/O Abdominal Pain and Lymphadenopathy: --Suspect infectious/inflammatory process. Patient was treated for diverticulitis with pain that resolved.   No orders of the defined types were placed in this encounter.   All questions were answered. The patient knows to call the clinic with any problems, questions or concerns.  I have spent a total of 30  minutes minutes of face-to-face and non-face-to-face time, preparing to see the patient,  performing a medically appropriate examination, counseling and  educating the patient, documenting clinical information in the electronic health record, and care coordination.   Johnston Police, PA-C Department of Hematology/Oncology [facility] Phone: [phone]  Resources:  [place] HERO, Kandyce BD, [place] D, Caligaris-Cappio F, Dighiero G, Dhner H, Hillmen P, Ke[place] M, Montserr[place] E, Chiorazzi N, [place], Rai KR, [place], Eichhorst B, O'Brien S, Robak T, Seymour JF, [place] UG. iwCLL guidelines for diagnosis, indications for treatment, response assessment, and supportive management of CLL. Blood. 2018 Jun 21;131(25):2745-2760.  Active disease should be clearly documented to initiate therapy. At least 1 of the following criteria should be met.  1) Evidence of progressive marrow failure as manifested by the development of, or worsening of, anemia and/or thrombocytopenia. Cutoff levels of Hb <10 g/dL or platelet counts <899  109/L are generally regarded as indication for treatment. However, in some patients, platelet counts <100  109/L may remain stable over a long period; this situation does not automatically require therapeutic intervention. 2) Massive (ie, >=6 cm below the left costal margin) or progressive or symptomatic splenomegaly. 3) Massive nodes (ie, >=10 cm in longest diameter) or progressive or symptomatic lymphadenopathy. 4) Progressive lymphocytosis with an increase of >=50% over a 36-month period, or lymphocyte doubling time (LDT) <6 months. LDT can be obtained by linear regression extrapolation of absolute lymphocyte counts obtained at intervals of 2 weeks over an observation period of 2 to 3 months; patients with initial blood lymphocyte counts <30  109/L  may require a longer observation period to determine the LDT. Factors contributing to lymphocytosis other than CLL (eg, infections, steroid administration) should be excluded. 5) Autoimmune complications including anemia or thrombocytopenia poorly  responsive to corticosteroids. 6) Symptomatic or functional extranodal involvement (eg, skin, kidney, lung, spine). Disease-related symptoms as defined by any of the following: Unintentional weight loss >=10% within the previous 6 months. Significant fatigue (ie, ECOG performance scale 2 or worse; cannot work or unable to perform usual activities). Fevers >=100.64F or 38.0C for 2 or more weeks without evidence of infection. Night sweats for >=1 month without evidence of infection.  I have read the above note and personally examined the patient. I agree with the assessment and plan as noted above.  Briefly Philip Tran is a 69 year old male who follows with our clinic for diagnosis of CLL.  He has previously been seen by our APP's but I have not yet met him personally yet.  Today I introduced myself and assured he had a good understanding of his disease and plans moving forward.  He has a diagnosis of CLL which appears to be stable and indolent at this time.  There is no clear indication for treatment.  He reports that he has not been having any trouble with fevers, chills, sweats or unexpected weight loss.  He denies any blood bumps or lumps concerning for lymphadenopathy.  He has good appetite with no abdominal swelling.  Today we discussed the indications for treatments and the reasons for an earlier return to clinic visit.  He voiced understanding of the disease process and our recommendations moving forward.  Full 10 point ROS otherwise negative.   Philip Tran Kidney, MD Department of Hematology/Oncology [facility] Phone: [phone] Pager: [phone] Email: [email or web address]  "

## 2024-06-26 ENCOUNTER — Inpatient Hospital Stay: Attending: Physician Assistant | Admitting: Physician Assistant

## 2024-06-26 VITALS — BP 139/89 | HR 77 | Temp 97.9°F | Resp 18 | Ht 65.5 in | Wt 181.5 lb

## 2024-06-26 DIAGNOSIS — Z87891 Personal history of nicotine dependence: Secondary | ICD-10-CM | POA: Insufficient documentation

## 2024-06-26 DIAGNOSIS — C9110 Chronic lymphocytic leukemia of B-cell type not having achieved remission: Secondary | ICD-10-CM | POA: Insufficient documentation

## 2024-07-04 ENCOUNTER — Inpatient Hospital Stay: Admitting: Physician Assistant

## 2024-07-09 ENCOUNTER — Inpatient Hospital Stay: Admitting: Hematology and Oncology

## 2024-07-10 ENCOUNTER — Ambulatory Visit: Admitting: Dermatology

## 2024-07-10 DIAGNOSIS — L821 Other seborrheic keratosis: Secondary | ICD-10-CM

## 2024-07-10 DIAGNOSIS — Z85828 Personal history of other malignant neoplasm of skin: Secondary | ICD-10-CM | POA: Diagnosis not present

## 2024-07-10 DIAGNOSIS — D1801 Hemangioma of skin and subcutaneous tissue: Secondary | ICD-10-CM

## 2024-07-10 DIAGNOSIS — I781 Nevus, non-neoplastic: Secondary | ICD-10-CM

## 2024-07-10 DIAGNOSIS — L729 Follicular cyst of the skin and subcutaneous tissue, unspecified: Secondary | ICD-10-CM

## 2024-07-10 DIAGNOSIS — Z872 Personal history of diseases of the skin and subcutaneous tissue: Secondary | ICD-10-CM | POA: Diagnosis not present

## 2024-07-10 DIAGNOSIS — R238 Other skin changes: Secondary | ICD-10-CM

## 2024-07-10 DIAGNOSIS — W908XXA Exposure to other nonionizing radiation, initial encounter: Secondary | ICD-10-CM | POA: Diagnosis not present

## 2024-07-10 DIAGNOSIS — L853 Xerosis cutis: Secondary | ICD-10-CM | POA: Diagnosis not present

## 2024-07-10 DIAGNOSIS — Z1283 Encounter for screening for malignant neoplasm of skin: Secondary | ICD-10-CM

## 2024-07-10 DIAGNOSIS — L814 Other melanin hyperpigmentation: Secondary | ICD-10-CM | POA: Diagnosis not present

## 2024-07-10 DIAGNOSIS — L578 Other skin changes due to chronic exposure to nonionizing radiation: Secondary | ICD-10-CM

## 2024-07-10 DIAGNOSIS — L720 Epidermal cyst: Secondary | ICD-10-CM | POA: Diagnosis not present

## 2024-07-10 DIAGNOSIS — D229 Melanocytic nevi, unspecified: Secondary | ICD-10-CM

## 2024-07-10 NOTE — Patient Instructions (Addendum)
 Seborrheic Keratosis  What causes seborrheic keratoses? Seborrheic keratoses are harmless, common skin growths that first appear during adult life.  As time goes by, more growths appear.  Some people may develop a large number of them.  Seborrheic keratoses appear on both covered and uncovered body parts.  They are not caused by sunlight.  The tendency to develop seborrheic keratoses can be inherited.  They vary in color from skin-colored to gray, brown, or even black.  They can be either smooth or have a rough, warty surface.   Seborrheic keratoses are superficial and look as if they were stuck on the skin.  Under the microscope this type of keratosis looks like layers upon layers of skin.  That is why at times the top layer may seem to fall off, but the rest of the growth remains and re-grows.    Treatment Seborrheic keratoses do not need to be treated, but can easily be removed in the office.  Seborrheic keratoses often cause symptoms when they rub on clothing or jewelry.  Lesions can be in the way of shaving.  If they become inflamed, they can cause itching, soreness, or burning.  Removal of a seborrheic keratosis can be accomplished by freezing, burning, or surgery. If any spot bleeds, scabs, or grows rapidly, please return to have it checked, as these can be an indication of a skin cancer.   Gentle Skin Care Guide  1. Bathe no more than once a day.  2. Avoid bathing in hot water  3. Use a mild soap like Dove, Vanicream, Cetaphil, CeraVe. Can use Lever 2000 or Cetaphil antibacterial soap  4. Use soap only where you need it. On most days, use it under your arms, between your legs, and on your feet. Let the water rinse other areas unless visibly dirty.  5. When you get out of the bath/shower, use a towel to gently blot your skin dry, don't rub it.  6. While your skin is still a little damp, apply a moisturizing cream such as Vanicream, CeraVe, Cetaphil, Eucerin, Sarna lotion or plain  Vaseline Jelly. For hands apply Neutrogena Norwegian Hand Cream or Excipial Hand Cream.  7. Reapply moisturizer any time you start to itch or feel dry.  8. Sometimes using free and clear laundry detergents can be helpful. Fabric softener sheets should be avoided. Downy Free & Gentle liquid, or any liquid fabric softener that is free of dyes and perfumes, it acceptable to use  9. If your doctor has given you prescription creams you may apply moisturizers over them     Due to recent changes in healthcare laws, you may see results of your pathology and/or laboratory studies on MyChart before the doctors have had a chance to review them. We understand that in some cases there may be results that are confusing or concerning to you. Please understand that not all results are received at the same time and often the doctors may need to interpret multiple results in order to provide you with the best plan of care or course of treatment. Therefore, we ask that you please give us  2 business days to thoroughly review all your results before contacting the office for clarification. Should we see a critical lab result, you will be contacted sooner.   If You Need Anything After Your Visit  If you have any questions or concerns for your doctor, please call our main line at [phone] and press option 4 to reach your doctor's medical assistant. If no one  answers, please leave a voicemail as directed and we will return your call as soon as possible. Messages left after 4 pm will be answered the following business day.   You may also send us  a message via MyChart. We typically respond to MyChart messages within 1-2 business days.  For prescription refills, please ask your pharmacy to contact our office. Our fax number is [phone].  If you have an urgent issue when the clinic is closed that cannot wait until the next business day, you can page your doctor at the number below.    Please note that while we do  our best to be available for urgent issues outside of office hours, we are not available 24/7.   If you have an urgent issue and are unable to reach us , you may choose to seek medical care at your doctor's office, retail clinic, urgent care center, or emergency room.  If you have a medical emergency, please immediately call 911 or go to the emergency department.  Pager Numbers  - Dr. Hester: [phone]  - Dr. Jackquline: [phone]  - Dr. Claudene: [phone]   - Dr. Raymund: [phone]  In the event of inclement weather, please call our main line at [phone] for an update on the status of any delays or closures.  Dermatology Medication Tips: Please keep the boxes that topical medications come in in order to help keep track of the instructions about where and how to use these. Pharmacies typically print the medication instructions only on the boxes and not directly on the medication tubes.   If your medication is too expensive, please contact our office at [phone] option 4 or send us  a message through MyChart.   We are unable to tell what your co-pay for medications will be in advance as this is different depending on your insurance coverage. However, we may be able to find a substitute medication at lower cost or fill out paperwork to get insurance to cover a needed medication.   If a prior authorization is required to get your medication covered by your insurance company, please allow us  1-2 business days to complete this process.  Drug prices often vary depending on where the prescription is filled and some pharmacies may offer cheaper prices.  The website www.goodrx.com contains coupons for medications through different pharmacies. The prices here do not account for what the cost may be with help from insurance (it may be cheaper with your insurance), but the website can give you the price if you did not use any insurance.  - You can print the associated coupon and take  it with your prescription to the pharmacy.  - You may also stop by our office during regular business hours and pick up a GoodRx coupon card.  - If you need your prescription sent electronically to a different pharmacy, notify our office through [facility] or by phone at [phone] option 4.     Si Usted Necesita Algo Despus de Su Visita  Tambin puede enviarnos un mensaje a travs de Clinical Cytogeneticist. Por lo general respondemos a los mensajes de MyChart en el transcurso de 1 a 2 das hbiles.  Para renovar recetas, por favor pida a su farmacia que se ponga en contacto con nuestra oficina. Randi lakes de fax es [place] [phone].  Si tiene un asunto urgente cuando la clnica est cerrada y que no puede esperar hasta el siguiente da hbil, puede llamar/localizar a su doctor(a) al nmero que aparece a continuacin.  Por favor, tenga en cuenta que aunque hacemos todo lo posible para estar disponibles para asuntos urgentes fuera del horario de [place], no estamos disponibles las 24 horas del da, los 7 [address]  [phone] de la [place].   Si tiene un problema urgente y no puede comunicarse con nosotros, puede optar por buscar atencin mdica  en el consultorio de su doctor(a), en una clnica privada, en un centro de atencin urgente o en una sala de emergencias.  Si tiene engineer, drilling, por favor llame inmediatamente al 911 o vaya a la sala de emergencias.  Nmeros de bper  - Dr. Hester: [phone]  - Dra. Jackquline: [phone]  - Dr. Claudene: [phone]  - Dra. Kitts: [phone]  En caso de inclemencias del [place], por favor llame a nuestra lnea principal al [phone] para una actualizacin sobre el estado de cualquier retraso o cierre.  Consejos para la medicacin en dermatologa: Por favor, guarde las cajas en las que vienen los medicamentos de uso tpico para ayudarle a seguir las instrucciones sobre dnde y cmo usarlos. Las farmacias generalmente imprimen las  instrucciones del medicamento slo en las cajas y no directamente en los tubos del [place].   Si su medicamento es muy caro, por favor, pngase en contacto con landry rieger llamando al [phone] y presione la opcin 4 o envenos un mensaje a travs de Clinical Cytogeneticist.   No podemos decirle cul ser su copago por los medicamentos por adelantado ya que esto es diferente dependiendo de la cobertura de su seguro. Sin embargo, es posible que podamos encontrar un medicamento sustituto a audiological scientist un formulario para que el seguro cubra el medicamento que se considera necesario.   Si se requiere una autorizacin previa para que su compaa de seguros cubra su medicamento, por favor permtanos de 1 a 2 das hbiles para completar este proceso.  Los precios de los medicamentos varan con frecuencia dependiendo del environmental consultant de dnde se surte la receta y alguna farmacias pueden ofrecer precios ms baratos.  El sitio web www.goodrx.com tiene cupones para medicamentos de health and safety inspector. Los precios aqu no tienen en cuenta lo que podra costar con la ayuda del seguro (puede ser ms barato con su seguro), pero el sitio web puede darle el precio si no utiliz tourist information centre manager.  - Puede imprimir el cupn correspondiente y llevarlo con su receta a la farmacia.  - Tambin puede pasar por nuestra oficina durante el horario de atencin regular y education officer, museum una tarjeta de cupones de GoodRx.  - Si necesita que su receta se enve electrnicamente a una farmacia diferente, informe a nuestra oficina a travs de MyChart de [place] o por telfono llamando al [phone] y presione la opcin 4.

## 2024-07-10 NOTE — Progress Notes (Signed)
 "  Follow-Up Visit   Subjective  Philip Tran is a 69 y.o. male who presents for the following: Skin Cancer Screening and Upper Body Skin Exam. History of BCCs.  The patient presents for Upper Body Skin Exam (UBSE) for skin cancer screening and mole check. The patient has spots, moles and lesions to be evaluated, some may be new or changing. He has a spot in the left ear x 2 months.  He also has spots on on the thigh to check, not bothersome.   The following portions of the chart were reviewed this encounter and updated as appropriate: medications, allergies, medical history  Review of Systems:  No other skin or systemic complaints except as noted in HPI or Assessment and Plan.  Objective  Well appearing patient in no apparent distress; mood and affect are within normal limits.  All skin waist up examined. Relevant physical exam findings are noted in the Assessment and Plan.    Assessment & Plan    Skin cancer screening performed today.  Actinic Damage - Chronic condition, secondary to cumulative UV/sun exposure - diffuse scaly erythematous macules with underlying dyspigmentation - Recommend daily broad spectrum sunscreen SPF 30+ to sun-exposed areas, reapply every 2 hours as needed.  - Staying in the shade or wearing long sleeves, sun glasses (UVA+UVB protection) and wide brim hats (4-inch brim around the entire circumference of the hat) are also recommended for sun protection.  - Call for new or changing lesions.  Lentigines (including left ear helix), Seborrheic Keratoses (including right thigh), Hemangiomas - Benign normal skin lesions - Benign-appearing - Call for any changes  Melanocytic Nevi - Tan-brown and/or pink-flesh-colored symmetric macules and papules - Benign appearing on exam today - Observation - Call clinic for new or changing moles - Recommend daily use of broad spectrum spf 30+ sunscreen to sun-exposed areas.   HISTORY OF BASAL CELL CARCINOMA OF THE  SKIN Right ear helix, EDC, 07/06/2023 Right cheek, Mohs, 2023 - No evidence of recurrence today - Recommend regular full body skin exams - Recommend daily broad spectrum sunscreen SPF 30+ to sun-exposed areas, reapply every 2 hours as needed.  - Call if any new or changing lesions are noted between office visits   EPIDERMAL INCLUSION CYST Exam: Subcutaneous nodule at right upper eyebrow  Benign-appearing. Exam most consistent with an epidermal inclusion cyst. Discussed that a cyst is a benign growth that can grow over time and sometimes get irritated or inflamed. Recommend observation if it is not bothersome. Discussed option of surgical excision to remove it if it is growing, symptomatic, or other changes noted. Please call for new or changing lesions so they can be evaluated.  Xerosis - diffuse xerotic patches - recommend gentle, hydrating skin care - gentle skin care handout given  HISTORY OF PRECANCEROUS ACTINIC KERATOSIS - site(s) of PreCancerous Actinic Keratosis clear today. - these may recur and new lesions may form requiring treatment to prevent transformation into skin cancer - observe for new or changing spots and contact [facility] for appointment if occur - photoprotection with sun protective clothing; sunglasses and broad spectrum sunscreen with SPF of at least 30 + and frequent self skin exams recommended - yearly exams by a dermatologist recommended for persons with history of PreCancerous Actinic Keratoses   [place] Exam: blanching purple macule at left ear antihelix  Treatment Plan: Benign-appearing. Observe    Return in about 1 year (around 07/10/2025) for UBSE, Hx BCC, Hx AKs.  I, Andrea Kerns,  CMA, am acting as scribe for Rexene Rattler, MD .   Documentation: I have reviewed the above documentation for accuracy and completeness, and I agree with the above.  Rexene Rattler, MD    "

## 2024-07-16 ENCOUNTER — Ambulatory Visit

## 2024-07-27 ENCOUNTER — Ambulatory Visit

## 2024-12-25 ENCOUNTER — Inpatient Hospital Stay

## 2024-12-25 ENCOUNTER — Inpatient Hospital Stay: Admitting: Physician Assistant

## 2024-12-26 ENCOUNTER — Inpatient Hospital Stay

## 2024-12-26 ENCOUNTER — Inpatient Hospital Stay: Admitting: Hematology and Oncology

## 2025-07-16 ENCOUNTER — Ambulatory Visit: Admitting: Dermatology
